# Patient Record
Sex: Female | Born: 1940 | Race: White | Hispanic: No | Marital: Married | Smoking: Current every day smoker
Health system: Southern US, Community
[De-identification: ages and names within clinical notes are randomized; demographics above are authoritative.]

## PROBLEM LIST (undated history)

## (undated) DIAGNOSIS — R413 Other amnesia: Secondary | ICD-10-CM

## (undated) DIAGNOSIS — IMO0001 Reserved for inherently not codable concepts without codable children: Secondary | ICD-10-CM

## (undated) DIAGNOSIS — Z972 Presence of dental prosthetic device (complete) (partial): Secondary | ICD-10-CM

## (undated) DIAGNOSIS — K219 Gastro-esophageal reflux disease without esophagitis: Secondary | ICD-10-CM

## (undated) DIAGNOSIS — J45909 Unspecified asthma, uncomplicated: Secondary | ICD-10-CM

## (undated) DIAGNOSIS — K209 Esophagitis, unspecified without bleeding: Secondary | ICD-10-CM

## (undated) DIAGNOSIS — F32A Depression, unspecified: Secondary | ICD-10-CM

## (undated) DIAGNOSIS — Z720 Tobacco use: Secondary | ICD-10-CM

## (undated) DIAGNOSIS — J449 Chronic obstructive pulmonary disease, unspecified: Secondary | ICD-10-CM

## (undated) DIAGNOSIS — M810 Age-related osteoporosis without current pathological fracture: Secondary | ICD-10-CM

## (undated) DIAGNOSIS — H269 Unspecified cataract: Secondary | ICD-10-CM

## (undated) DIAGNOSIS — F329 Major depressive disorder, single episode, unspecified: Secondary | ICD-10-CM

## (undated) DIAGNOSIS — G4700 Insomnia, unspecified: Secondary | ICD-10-CM

## (undated) DIAGNOSIS — F419 Anxiety disorder, unspecified: Secondary | ICD-10-CM

## (undated) HISTORY — DX: Major depressive disorder, single episode, unspecified: F32.9

## (undated) HISTORY — DX: Esophagitis, unspecified: K20.9

## (undated) HISTORY — DX: Tobacco use: Z72.0

## (undated) HISTORY — DX: Unspecified cataract: H26.9

## (undated) HISTORY — DX: Age-related osteoporosis without current pathological fracture: M81.0

## (undated) HISTORY — DX: Depression, unspecified: F32.A

## (undated) HISTORY — DX: Insomnia, unspecified: G47.00

## (undated) HISTORY — DX: Anxiety disorder, unspecified: F41.9

## (undated) HISTORY — DX: Esophagitis, unspecified without bleeding: K20.90

## (undated) HISTORY — DX: Gastro-esophageal reflux disease without esophagitis: K21.9

## (undated) HISTORY — DX: Chronic obstructive pulmonary disease, unspecified: J44.9

## (undated) HISTORY — PX: ABDOMINAL HYSTERECTOMY: SHX81

## (undated) HISTORY — DX: Unspecified asthma, uncomplicated: J45.909

---

## 2007-06-10 ENCOUNTER — Other Ambulatory Visit: Payer: Self-pay

## 2007-06-10 ENCOUNTER — Emergency Department: Payer: Self-pay | Admitting: Internal Medicine

## 2007-06-18 ENCOUNTER — Inpatient Hospital Stay: Payer: Self-pay | Admitting: Internal Medicine

## 2007-06-18 ENCOUNTER — Other Ambulatory Visit: Payer: Self-pay

## 2007-06-18 ENCOUNTER — Ambulatory Visit: Payer: Self-pay | Admitting: Family Medicine

## 2007-07-12 ENCOUNTER — Ambulatory Visit: Payer: Self-pay | Admitting: Internal Medicine

## 2007-08-14 ENCOUNTER — Ambulatory Visit: Payer: Self-pay | Admitting: Internal Medicine

## 2007-08-27 ENCOUNTER — Ambulatory Visit: Payer: Self-pay | Admitting: Internal Medicine

## 2007-12-27 ENCOUNTER — Ambulatory Visit: Payer: Self-pay | Admitting: Internal Medicine

## 2008-02-10 ENCOUNTER — Emergency Department: Payer: Self-pay | Admitting: Emergency Medicine

## 2008-02-10 ENCOUNTER — Other Ambulatory Visit: Payer: Self-pay

## 2008-07-31 ENCOUNTER — Ambulatory Visit: Payer: Self-pay | Admitting: Internal Medicine

## 2008-12-15 IMAGING — CR DG CHEST 2V
1 series · 2 of 2 positions shown · non-contrast
Comparison: none

REASON FOR EXAM: Right ML abscess, PNA
COMMENTS:

[Series 1: view not recorded · 0.17mm/px · 2 of 2 slices shown]
[im 1/2]
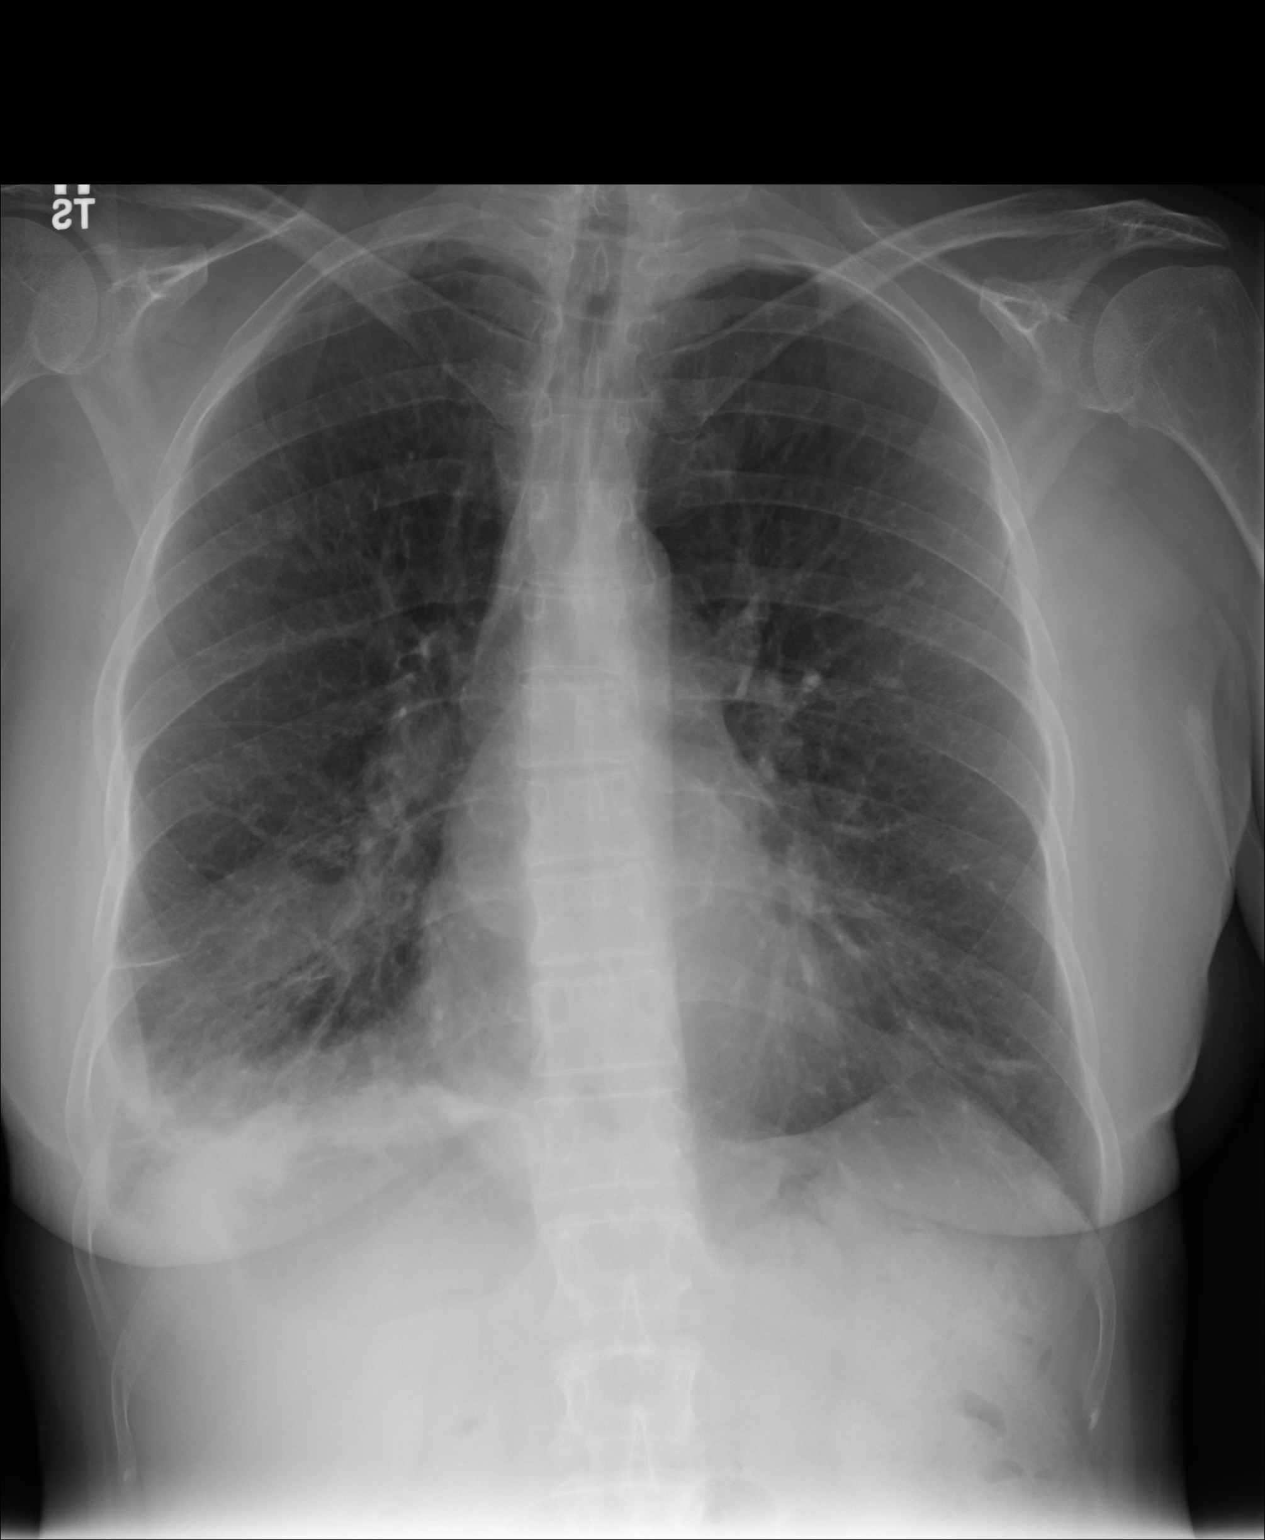
[im 2/2]
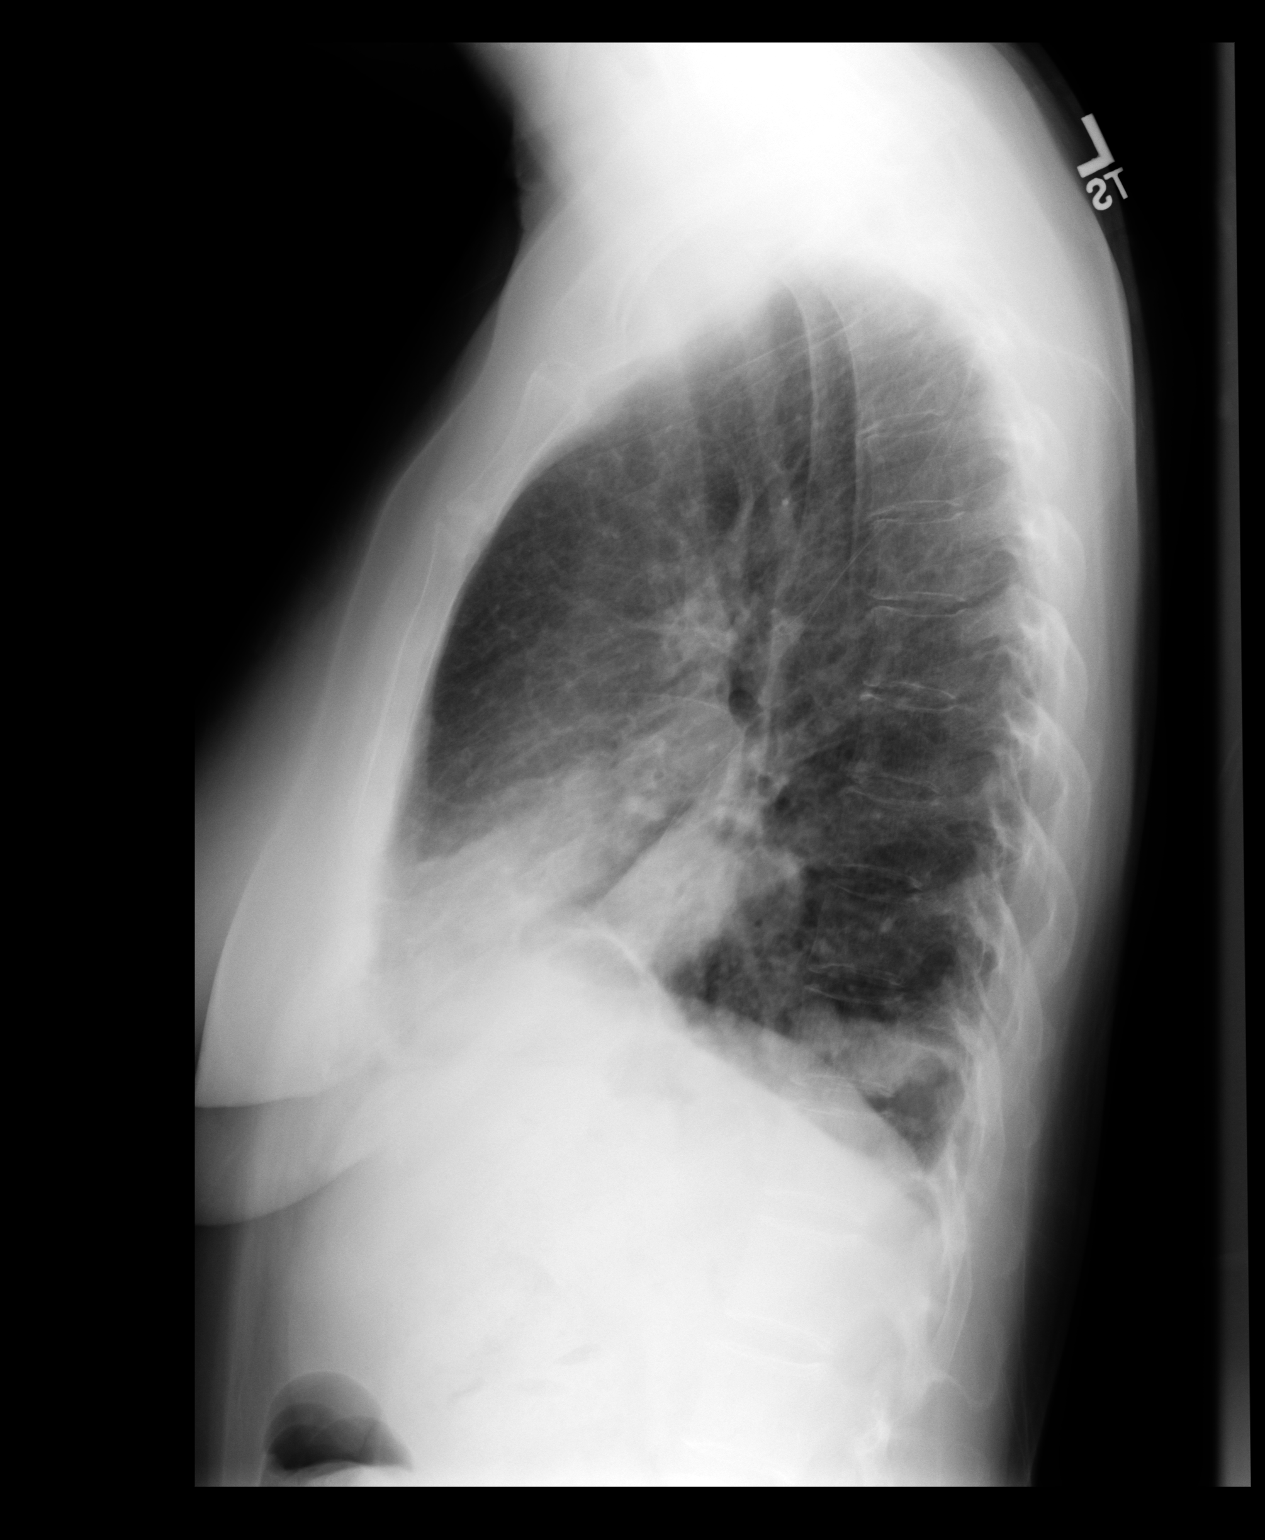

[2 of 2 positions shown; findings below may reference images not displayed]

PROCEDURE:     DXR - DXR CHEST PA (OR AP) AND LATERAL  - June 21, 2007  [DATE]

RESULT:     Comparison is made to the prior exam of 06/18/2007.

Bilateral pulmonary infiltrates compatible with pneumonia are again seen. On
the RIGHT, there is again noted a lucent area suspicious for early abscess
formation. This appears less prominent than on the exam of 06/18/2007. No
new pulmonary infiltrates are seen. The heart size is normal.
IMPRESSION: 1.  Persistent bilateral consolidated infiltrative changes are noted, more
prominent on the RIGHT.
2.  There is a radiolucency observed on the RIGHT consistent with cystic
change or possibly early abscess. This finding is noted to be less prominent
than on the exam of 06/18/2007.

## 2009-02-07 IMAGING — CT CT CHEST W/ CM
1 series · 15 of 31 positions shown, 19 images · IV contrast (agent unspecified)
Comparison: none

REASON FOR EXAM: Bronchitis   cough  eval condition
COMMENTS:

PROCEDURE:     CT  - CT CHEST WITH CONTRAST  - August 14, 2007  [DATE]
RESULT:     Comparison: Chest CT on 07/12/2007.
TECHNIQUE: CT examination of the chest was performed after intravenous
administration of 75 cc of [id number] nonionic contrast. Collimation is 5
mm.

[Series 2: soft tissue · axial · 0.64mm/px · z∈[+284,+574]mm · 15 of 64 slices shown, 19 images]
[im 3/64  mediastinal]
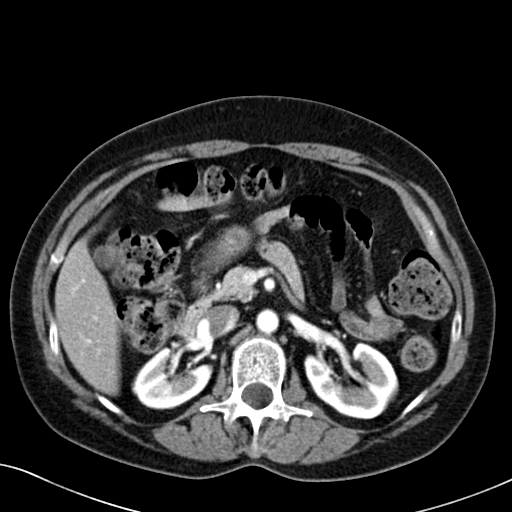
[im 3/64  lung]
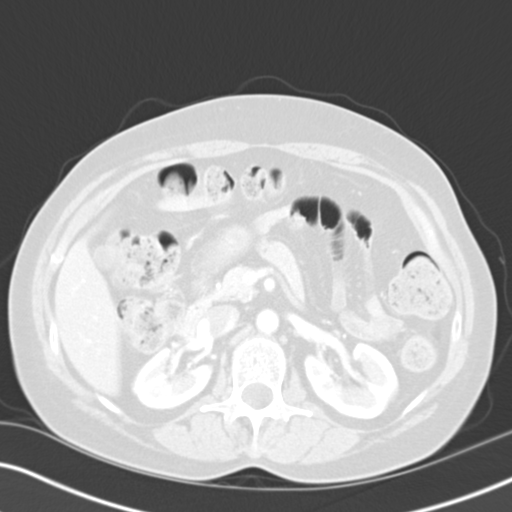
[im 8/64  lung]
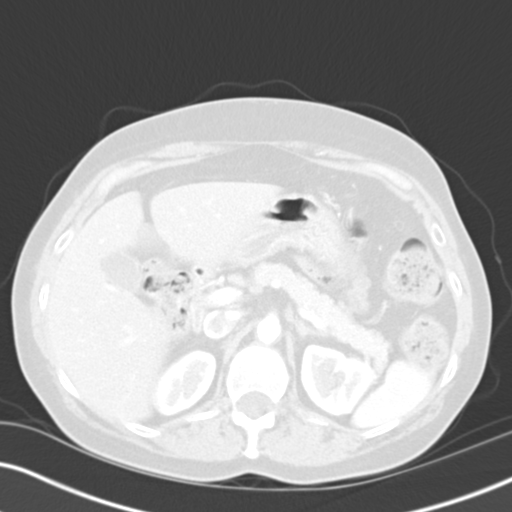
[im 12/64  lung]
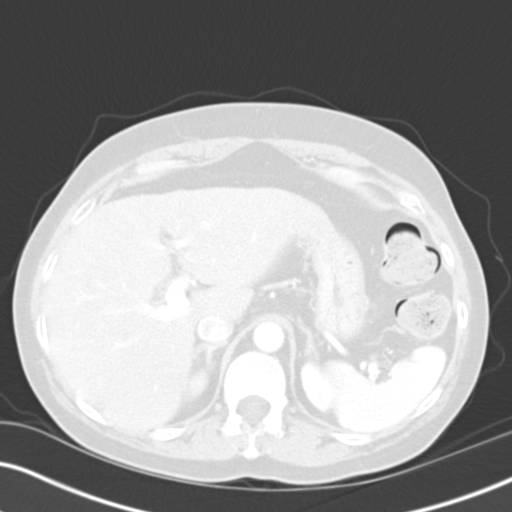
[im 15/64  lung]
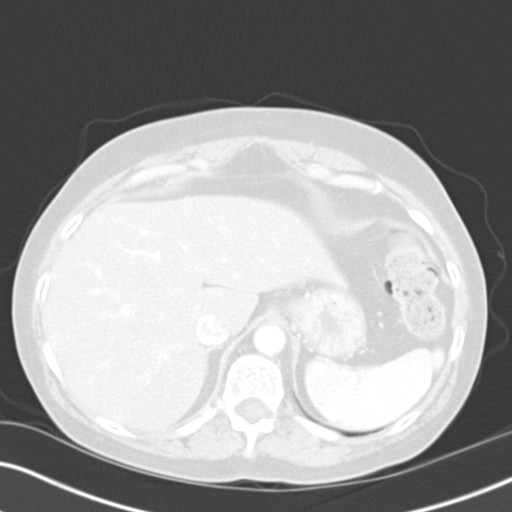
[im 19/64  mediastinal]
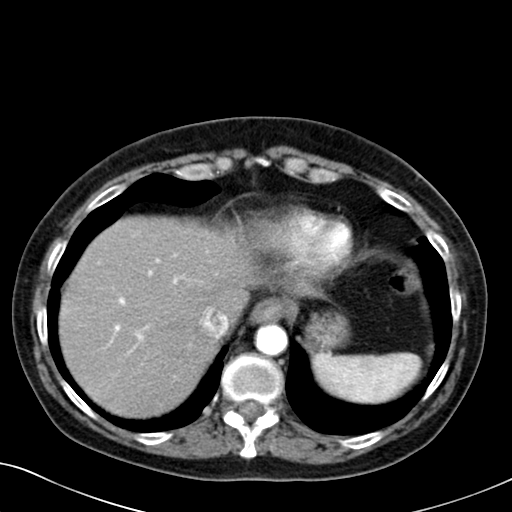
[im 19/64  lung]
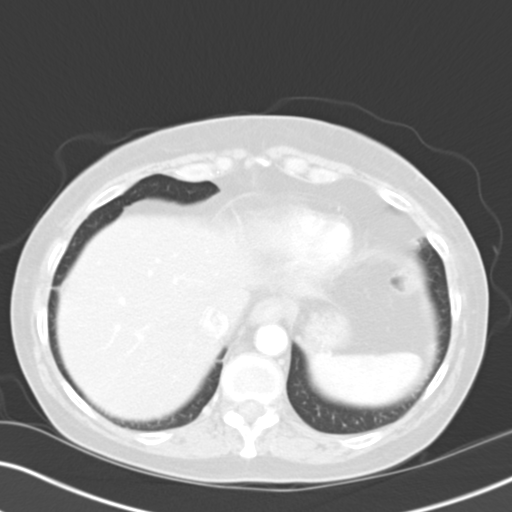
[im 24/64  lung]
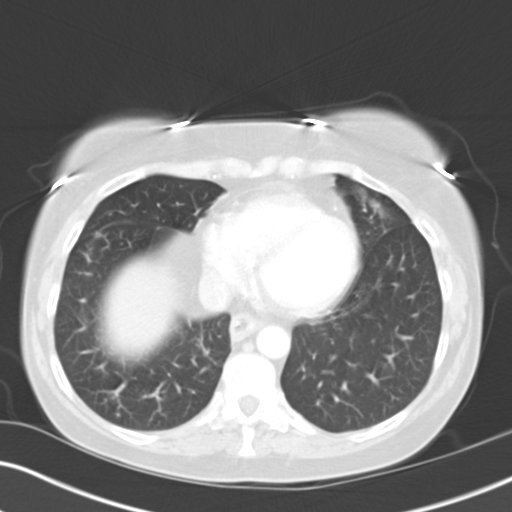
[im 29/64  lung]
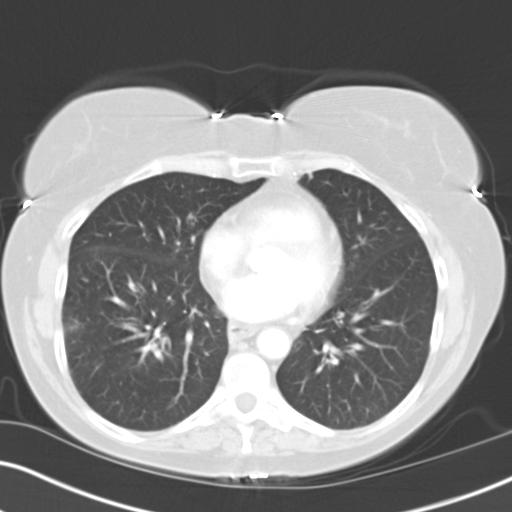
[im 33/64  lung]
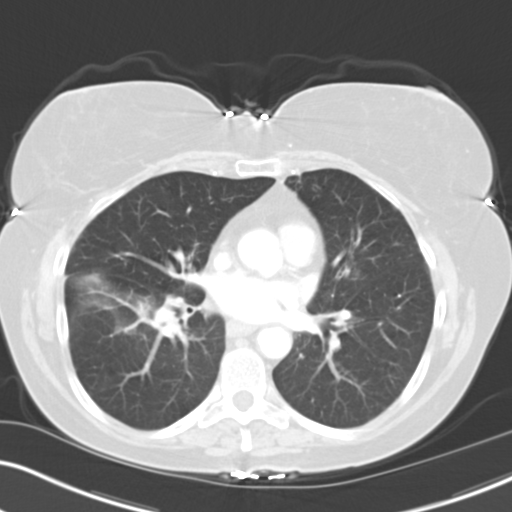
[im 36/64  mediastinal]
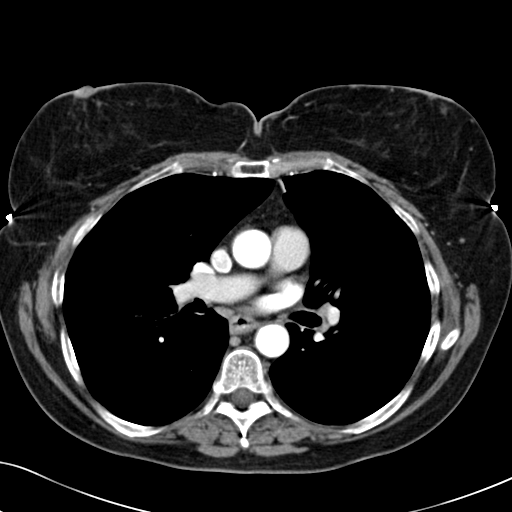
[im 36/64  lung]
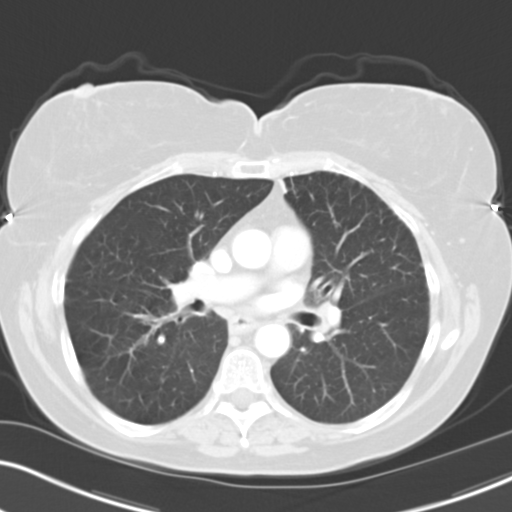
[im 40/64  lung]
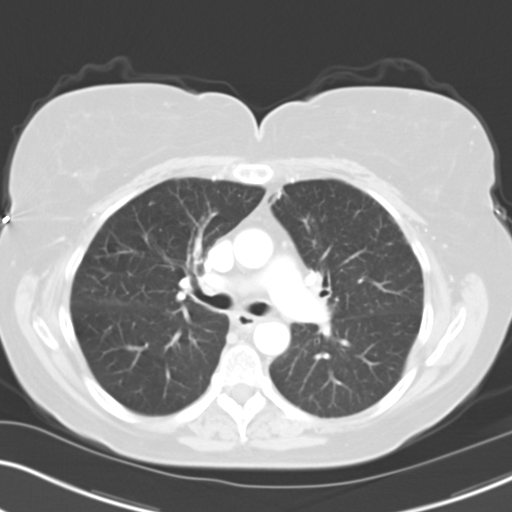
[im 45/64  lung]
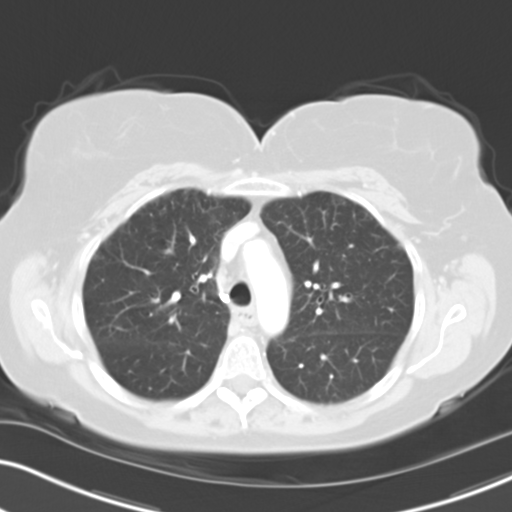
[im 50/64  lung]
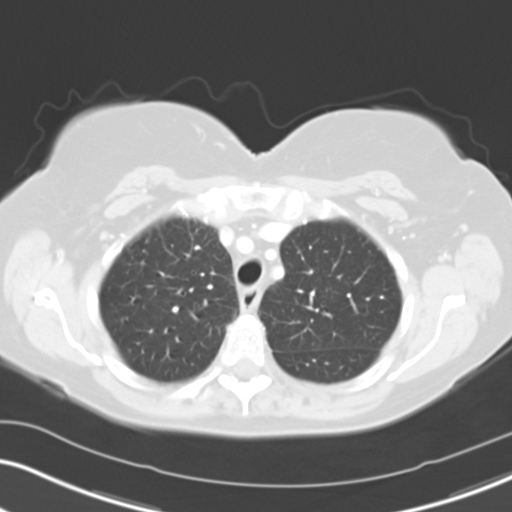
[im 52/64  mediastinal]
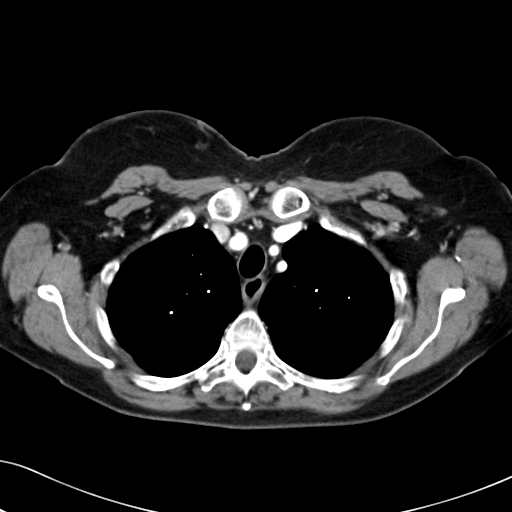
[im 52/64  lung]
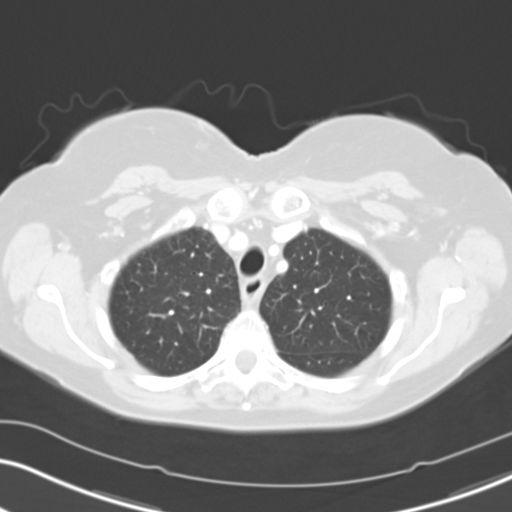
[im 57/64  lung]
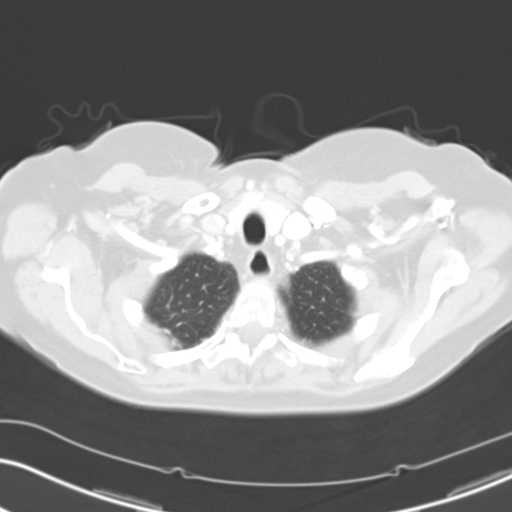
[im 61/64  lung]
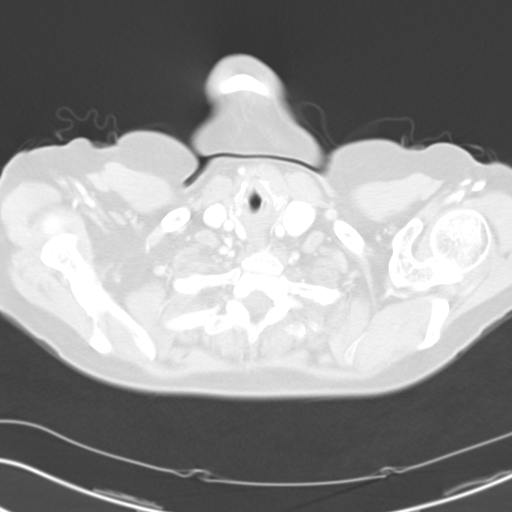

[15 of 31 positions shown; findings below may reference images not displayed]

FINDINGS: Redemonstration of heterogeneous, nodular appearance of the lower pole left
thyroid gland with few calcifications, similar compare to the previous exam.
Interval decrease in right basilar pulmonary opacities with mild residual.
There is minimal lingular atelectasis. 3 mm nodular opacity involving the
right middle lobe is stable compare to the previous exam. No new pulmonary
consolidation. No pulmonary edema. There is no pleural nor pericardial
effusion. There is no pneumothorax. There are no enlarged mediastinal,
hilar, or axillary lymph nodes. No displaced rib fracture is noted.

Limited visualization of the upper abdomen shows fatty infiltration of the
liver..
IMPRESSION: 1. Interval decrease in right basilar pulmonary opacities with mild
residual.
2. Redemonstration of heterogeneous, nodular appearance of the lower pole
left thyroid gland with few calcifications, similar compare to the previous
exam. This can be better evaluated with thyroid sonogram, as clinically
indicated.
3. Fatty infiltration of the liver.

## 2009-10-29 ENCOUNTER — Ambulatory Visit: Payer: Self-pay | Admitting: Internal Medicine

## 2011-12-30 HISTORY — PX: CEREBRAL ANEURYSM REPAIR: SHX164

## 2013-09-02 ENCOUNTER — Ambulatory Visit: Payer: Self-pay

## 2015-06-04 DIAGNOSIS — G4700 Insomnia, unspecified: Secondary | ICD-10-CM | POA: Insufficient documentation

## 2015-06-04 DIAGNOSIS — J45909 Unspecified asthma, uncomplicated: Secondary | ICD-10-CM | POA: Insufficient documentation

## 2015-06-04 DIAGNOSIS — F419 Anxiety disorder, unspecified: Secondary | ICD-10-CM | POA: Insufficient documentation

## 2015-06-04 DIAGNOSIS — J449 Chronic obstructive pulmonary disease, unspecified: Secondary | ICD-10-CM | POA: Insufficient documentation

## 2015-06-04 DIAGNOSIS — F32A Depression, unspecified: Secondary | ICD-10-CM | POA: Insufficient documentation

## 2015-06-04 DIAGNOSIS — F329 Major depressive disorder, single episode, unspecified: Secondary | ICD-10-CM | POA: Insufficient documentation

## 2015-06-04 DIAGNOSIS — M810 Age-related osteoporosis without current pathological fracture: Secondary | ICD-10-CM | POA: Insufficient documentation

## 2015-06-04 DIAGNOSIS — K209 Esophagitis, unspecified without bleeding: Secondary | ICD-10-CM | POA: Insufficient documentation

## 2015-06-04 DIAGNOSIS — K219 Gastro-esophageal reflux disease without esophagitis: Secondary | ICD-10-CM | POA: Insufficient documentation

## 2015-06-04 DIAGNOSIS — Z720 Tobacco use: Secondary | ICD-10-CM | POA: Insufficient documentation

## 2015-06-05 ENCOUNTER — Encounter: Payer: Self-pay | Admitting: Family Medicine

## 2015-06-05 ENCOUNTER — Ambulatory Visit (INDEPENDENT_AMBULATORY_CARE_PROVIDER_SITE_OTHER): Payer: Commercial Managed Care - HMO | Admitting: Family Medicine

## 2015-06-05 VITALS — BP 123/78 | HR 102 | Temp 98.6°F | Ht 61.2 in | Wt 121.1 lb

## 2015-06-05 DIAGNOSIS — J441 Chronic obstructive pulmonary disease with (acute) exacerbation: Secondary | ICD-10-CM

## 2015-06-05 MED ORDER — DOXYCYCLINE HYCLATE 100 MG PO CAPS: 100.0000 mg | ORAL_CAPSULE | Freq: Two times a day (BID) | ORAL | Status: DC

## 2015-06-05 MED ORDER — PREDNISONE 10 MG PO TABS: ORAL_TABLET | ORAL | Status: DC

## 2015-06-05 NOTE — Assessment & Plan Note (Signed)
Better following neb, lungs sound clearer. Will treat with doxycycline BID x 10 days and a 12 day prednisone taper. Call if not getting better or getting worse. Return in 2 weeks for lung recheck.

## 2015-06-05 NOTE — Progress Notes (Signed)
BP 123/78 mmHg  Pulse 102  Temp(Src) 98.6 F (37 C)  Ht 5' 1.2" (1.554 m)  Wt 121 lb 1.6 oz (54.931 kg)  BMI 22.75 kg/m2  SpO2 94%   Subjective:    Patient ID: Paula Krause, female    DOB: 1941-05-05, 74 y.o.   MRN: [id number]  HPI: Paula Krause is a 74 y.o. female  Chief Complaint  Patient presents with  . URI    X a couple weeks, patient states that she has a lot of nasal congestion and cough.  . Loss of Vision    X 4 days Right eye, has not called the eye doctor. Has had surgery on it previously   UPPER RESPIRATORY TRACT INFECTION x2 weeks Worst symptom: congestion Fever: yes Cough: yes Shortness of breath: no Wheezing: no Chest pain: no Chest tightness: no Chest congestion: no Nasal congestion: yes Runny nose: yes Post nasal drip: yes Sneezing: yes Sore throat: yes Swollen glands: yes Sinus pressure: yes Headache: no Face pain: yes Toothache: no Ear pain: no  Ear pressure: no  Eyes red/itching:no Eye drainage/crusting: yes  Vomiting: no Rash: no Fatigue: yes Sick contacts: no Strep contacts: no  Context: worse Recurrent sinusitis: no Relief with OTC cold/cough medications: no  Treatments attempted: cold/sinus, mucinex, anti-histamine, pseudoephedrine and cough syrup    Relevant past medical, surgical, family and social history reviewed and updated as indicated. Interim medical history since our last visit reviewed. Allergies and medications reviewed and updated.  Review of Systems  Constitutional: Positive for appetite change and fatigue. Negative for fever, chills, diaphoresis, activity change and unexpected weight change.  HENT: Positive for congestion, postnasal drip, rhinorrhea, sinus pressure, sneezing and sore throat. Negative for dental problem, drooling, ear discharge, ear pain, facial swelling, hearing loss, mouth sores, nosebleeds, tinnitus, trouble swallowing and voice change.   Respiratory: Negative for apnea, cough, choking,  chest tightness, shortness of breath, wheezing and stridor.   Cardiovascular: Negative for chest pain, palpitations and leg swelling.  Neurological: Negative.   Psychiatric/Behavioral: Negative.    Per HPI unless specifically indicated above     Objective:    BP 123/78 mmHg  Pulse 102  Temp(Src) 98.6 F (37 C)  Ht 5' 1.2" (1.554 m)  Wt 121 lb 1.6 oz (54.931 kg)  BMI 22.75 kg/m2  SpO2 94%  Wt Readings from Last 3 Encounters:  06/05/15 121 lb 1.6 oz (54.931 kg)  11/12/14 128 lb (58.06 kg)    Physical Exam  Constitutional: She is oriented to person, place, and time. She appears well-developed and well-nourished. No distress.  HENT:  Head: Normocephalic and atraumatic.  Right Ear: Hearing and external ear normal.  Left Ear: Hearing and external ear normal.  Nose: Mucosal edema and rhinorrhea present. No nose lacerations, sinus tenderness, nasal deformity, septal deviation or nasal septal hematoma. No epistaxis.  No foreign bodies. Right sinus exhibits no maxillary sinus tenderness and no frontal sinus tenderness. Left sinus exhibits no maxillary sinus tenderness and no frontal sinus tenderness.  Mouth/Throat: Oropharynx is clear and moist. No oropharyngeal exudate.  Cerumen bilaterally  Eyes: Conjunctivae and lids are normal. Right eye exhibits no discharge. Left eye exhibits no discharge. No scleral icterus.  Neck: Normal range of motion. Neck supple. No JVD present. No tracheal deviation present. No thyromegaly present.  Cardiovascular: Normal rate, regular rhythm and normal heart sounds.  Exam reveals no gallop and no friction rub.   No murmur heard. Pulmonary/Chest: Effort normal. No stridor. No respiratory distress. She  has decreased breath sounds in the right upper field, the right middle field, the right lower field, the left upper field, the left middle field and the left lower field. She has wheezes in the right upper field, the right middle field, the right lower field, the  left upper field, the left middle field and the left lower field. She has rhonchi in the right upper field, the right middle field, the right lower field, the left upper field, the left middle field and the left lower field. She has no rales. She exhibits no tenderness.  Musculoskeletal: Normal range of motion.  Lymphadenopathy:    She has cervical adenopathy.  Neurological: She is alert and oriented to person, place, and time.  Skin: Skin is warm, dry and intact. No rash noted. No erythema. No pallor.  Psychiatric: She has a normal mood and affect. Her speech is normal and behavior is normal. Judgment and thought content normal. Cognition and memory are normal.  Nursing note and vitals reviewed.   No results found for this or any previous visit.    Assessment & Plan:   Problem List Items Addressed This Visit      Respiratory   COPD exacerbation - Primary    Better following neb, lungs sound clearer. Will treat with doxycycline BID x 10 days and a 12 day prednisone taper. Call if not getting better or getting worse. Return in 2 weeks for lung recheck.           Follow up plan: Return in about 2 weeks (around 06/19/2015) for Lung recheck.

## 2015-06-05 NOTE — Patient Instructions (Signed)
Heat Stress in the Elderly  Elderly people (people aged 72 years and older) are more prone to heat stress than younger people for several reasons:   Elderly people do not adjust as well as young people to sudden changes in temperature.  They are more likely to have a chronic medical condition that upsets normal body responses to heat.  They are more likely to take prescription medicines that impair the body's ability to regulate its temperature or that inhibit perspiration. HEAT STROKE  Heat stroke is the most serious heat-related illness. It occurs when the body becomes unable to control its temperature. The body temperature rises rapidly. Then the body loses its ability to sweat and is unable to cool down. The body temperature rises to 105 F (40.6 C) or higher within 10 to 15 minutes. Heat stroke can cause death or permanent disability if emergency treatment is not provided. SYMPTOMS  Warning signs vary but may include the following:  An extremely high body temperature (above 103 F (39.4 C)).  Nausea.  Red, hot, and dry skin (no sweating).  Rapid, strong pulse.  Throbbing headache.  Dizziness. HEAT EXHAUSTION  Heat exhaustion is a milder form of heat-related illness. It can develop after several days of exposure to high temperatures and not enough fluids. SYMPTOMS  Warning signs vary but may include the following:   Heavy sweating. Paleness.  Muscle cramps.  Tiredness. Weakness.  Dizziness.  Headache. Nausea or vomiting.  Fainting.  Skin: may be cool and moist.  Pulse rate: fast and weak.  Breathing: fast and shallow. WHAT YOU CAN DO TO PROTECT YOURSELF  You can follow these prevention tips to protect yourself from heat-related stress:   Drink cool, nonalcoholic, non-caffeinated beverages. If your caregiver generally limits the amount of fluid you drink or has you on water pills, ask how much you should drink when the weather is hot. Avoid extremely cold  liquids. They can cause cramps.  Rest.  Take a cool shower, bath, or sponge bath.  If possible, seek an air-conditioned environment. If you do not have air conditioning, visit an air-conditioned shopping mall or [place] to cool off.  Emergency planning/management officer.  If possible, remain indoors in the heat of the day.  Do not engage in strenuous activities. WHAT YOU CAN DO TO HELP PROTECT ELDERLY RELATIVES AND NEIGHBORS  If you have elderly relatives or neighbors, help them protect themselves from heat-related stress.   Visit older adults at risk at least twice a day. Watch them for signs of heat exhaustion or heat stroke.  Take them to air-conditioned locations if they have transportation problems.  Make sure older adults have access to an electric fan whenever possible. WHAT YOU CAN DO FOR SOMEONE WITH HEAT STRESS   If you see any signs of severe heat stress, you may be dealing with a life-threatening emergency. Have someone call for immediate medical assistance while you begin cooling the affected person. Do the following:  Get the person to a shady area.  Cool the person rapidly, using whatever methods you can. For example, immerse the person in a tub of cool water or place the person in a cool shower. Spray the person with cool water from a garden hose or sponge the person with cool water. If the humidity is low, wrap the person in a cool, wet sheet. Fan him/her quickly.  Monitor body temperature. Continue cooling efforts until the body temperature drops to 101 - 102F (38.3  C - 38.9  C).  If emergency medical personnel are delayed, call the hospital emergency room for further instructions.  Do not give the person alcohol to drink.  Get medical care as soon as possible. Document Released: 11/02/2009 Document Revised: 02/06/2012 Document Reviewed: 11/02/2009 [facility] Patient Information 2015 [place], [place]. This information is not intended to replace advice given to you  by your health care provider. Make sure you discuss any questions you have with your health care provider.

## 2015-06-24 ENCOUNTER — Other Ambulatory Visit: Payer: Self-pay

## 2015-06-24 MED ORDER — AMITRIPTYLINE HCL 50 MG PO TABS: 200.0000 mg | ORAL_TABLET | Freq: Every day | ORAL | Status: DC

## 2015-06-24 NOTE — Telephone Encounter (Signed)
Patient called and asked if Paula Krause would send her in one refill for her amitriptyline to [facility]. Patient stated she is not able to come in right away because she is getting ready to have eye surgery and patient has been out of medication for a few days now.

## 2015-07-13 ENCOUNTER — Ambulatory Visit (INDEPENDENT_AMBULATORY_CARE_PROVIDER_SITE_OTHER): Payer: Commercial Managed Care - HMO | Admitting: Unknown Physician Specialty

## 2015-07-13 ENCOUNTER — Encounter: Payer: Self-pay | Admitting: Unknown Physician Specialty

## 2015-07-13 VITALS — BP 110/75 | HR 101 | Temp 98.6°F | Ht 61.6 in | Wt 124.4 lb

## 2015-07-13 DIAGNOSIS — K2100 Gastro-esophageal reflux disease with esophagitis, without bleeding: Secondary | ICD-10-CM

## 2015-07-13 DIAGNOSIS — G4700 Insomnia, unspecified: Secondary | ICD-10-CM

## 2015-07-13 DIAGNOSIS — Z01818 Encounter for other preprocedural examination: Secondary | ICD-10-CM

## 2015-07-13 DIAGNOSIS — H269 Unspecified cataract: Secondary | ICD-10-CM | POA: Diagnosis not present

## 2015-07-13 MED ORDER — BUDESONIDE-FORMOTEROL FUMARATE 160-4.5 MCG/ACT IN AERO: 2.0000 | INHALATION_SPRAY | Freq: Two times a day (BID) | RESPIRATORY_TRACT | Status: DC

## 2015-07-13 MED ORDER — ZALEPLON 5 MG PO CAPS: 5.0000 mg | ORAL_CAPSULE | Freq: Every evening | ORAL | Status: DC | PRN

## 2015-07-13 MED ORDER — OMEPRAZOLE 20 MG PO CPDR: 20.0000 mg | DELAYED_RELEASE_CAPSULE | Freq: Every day | ORAL | Status: AC

## 2015-07-13 MED ORDER — AMITRIPTYLINE HCL 50 MG PO TABS
200.0000 mg | ORAL_TABLET | Freq: Every day | ORAL | Status: DC
Start: 2015-07-13 — End: 2015-12-25

## 2015-07-13 NOTE — Progress Notes (Signed)
+++++++++++++++++++++++++++++++++++++++++++  BP 110/75 mmHg  Pulse 101  Temp(Src) 98.6 F (37 C)  Ht 5' 1.6" (1.565 m)  Wt 124 lb 6.4 oz (56.427 kg)  BMI 23.04 kg/m2  SpO2 95%   Subjective:    Patient ID: Paula Krause, female    DOB: 09-Jun-1941, 74 y.o.   MRN: [id number]  HPI: Paula Krause is a 74 y.o. female  Chief Complaint  Patient presents with  . Gastrophageal Reflux  . Insomnia  . Medication Refill   Gastrophageal Reflux She complains of dysphagia, a hoarse voice and water brash. Burning after meals and at night. This is a chronic problem. The current episode started more than 1 year ago. The problem occurs constantly. The problem has been unchanged. The symptoms are aggravated by caffeine. She has tried an antacid for the symptoms. The treatment provided no relief.  Insomnia The current episode started more than one month. The problem occurs nightly. Past treatments include medication.  States she is having trouble getting to sleep and up until 2-3 A.    Needs cataract surgery Can't see out of her right eye.  Needs surgery and approval  COPD Out of Symbicort.  Sees Dr Humphrey Rolls but wants a refill from me.    Relevant past medical, surgical, family and social history reviewed and updated as indicated. Interim medical history since our last visit reviewed. Allergies and medications reviewed and updated.  Review of Systems  HENT: Positive for hoarse voice.   Gastrointestinal: Positive for dysphagia.  Psychiatric/Behavioral: The patient has insomnia.     Per HPI unless specifically indicated above     Objective:    BP 110/75 mmHg  Pulse 101  Temp(Src) 98.6 F (37 C)  Ht 5' 1.6" (1.565 m)  Wt 124 lb 6.4 oz (56.427 kg)  BMI 23.04 kg/m2  SpO2 95%  Wt Readings from Last 3 Encounters:  07/13/15 124 lb 6.4 oz (56.427 kg)  06/05/15 121 lb 1.6 oz (54.931 kg)  11/12/14 128 lb (58.06 kg)    Physical Exam  Constitutional: She is oriented to person, place, and  time. She appears well-developed and well-nourished. No distress.  HENT:  Head: Normocephalic and atraumatic.  Eyes: Conjunctivae and lids are normal. Right eye exhibits no discharge. Left eye exhibits no discharge. No scleral icterus.  Cardiovascular: Normal rate and regular rhythm.   Pulmonary/Chest: Effort normal and breath sounds normal. No respiratory distress.  Abdominal: Normal appearance and bowel sounds are normal. She exhibits no distension. There is no splenomegaly or hepatomegaly. There is no tenderness.  Musculoskeletal: Normal range of motion.  Neurological: She is alert and oriented to person, place, and time.  Skin: Skin is warm, dry and intact. No rash noted. No pallor.  Psychiatric: She has a normal mood and affect. Her behavior is normal. Judgment and thought content normal.    No results found for this or any previous visit.    Assessment & Plan:   Problem List Items Addressed This Visit      Unprioritized   GERD (gastroesophageal reflux disease)    Will rx Omeprazole.  She has been on this before      Relevant Medications   omeprazole (PRILOSEC) 20 MG capsule   Insomnia - Primary    She will ask if her apartment can be moved away from the street.  Refilll Amitriptyline.  Rx for Sonata she agrees to use sparingly       Other Visit Diagnoses    Cataract, right  OK for surgery    Relevant Orders    CBC with Differential/Platelet    Comprehensive metabolic panel    Pre-op evaluation        High risk for general due to COPD.  OK if not general anesthesia    Relevant Orders    CBC with Differential/Platelet    Comprehensive metabolic panel        Follow up plan: Return for Needs pe scheduled.

## 2015-07-13 NOTE — Patient Instructions (Signed)
Insomnia Insomnia is frequent trouble falling and/or staying asleep. Insomnia can be a long term problem or a short term problem. Both are common. Insomnia can be a short term problem when the wakefulness is related to a certain stress or worry. Long term insomnia is often related to ongoing stress during waking hours and/or poor sleeping habits. Overtime, sleep deprivation itself can make the problem worse. Every little thing feels more severe because you are overtired and your ability to cope is decreased. CAUSES   Stress, anxiety, and depression.  Poor sleeping habits.  Distractions such as TV in the bedroom.  Naps close to bedtime.  Engaging in emotionally charged conversations before bed.  Technical reading before sleep.  Alcohol and other sedatives. They may make the problem worse. They can hurt normal sleep patterns and normal dream activity.  Stimulants such as caffeine for several hours prior to bedtime.  Pain syndromes and shortness of breath can cause insomnia.  Exercise late at night.  Changing time zones may cause sleeping problems (jet lag). It is sometimes helpful to have someone observe your sleeping patterns. They should look for periods of not breathing during the night (sleep apnea). They should also look to see how long those periods last. If you live alone or observers are uncertain, you can also be observed at a sleep clinic where your sleep patterns will be professionally monitored. Sleep apnea requires a checkup and treatment. Give your caregivers your medical history. Give your caregivers observations your family has made about your sleep.  SYMPTOMS   Not feeling rested in the morning.  Anxiety and restlessness at bedtime.  Difficulty falling and staying asleep. TREATMENT   Your caregiver may prescribe treatment for an underlying medical disorders. Your caregiver can give advice or help if you are using alcohol or other drugs for self-medication. Treatment  of underlying problems will usually eliminate insomnia problems.  Medications can be prescribed for short time use. They are generally not recommended for lengthy use.  Over-the-counter sleep medicines are not recommended for lengthy use. They can be habit forming.  You can promote easier sleeping by making lifestyle changes such as:  Using relaxation techniques that help with breathing and reduce muscle tension.  Exercising earlier in the day.  Changing your diet and the time of your last meal. No night time snacks.  Establish a regular time to go to bed.  Counseling can help with stressful problems and worry.  Soothing music and white noise may be helpful if there are background noises you cannot remove.  Stop tedious detailed work at least one hour before bedtime. HOME CARE INSTRUCTIONS   Keep a diary. Inform your caregiver about your progress. This includes any medication side effects. See your caregiver regularly. Take note of:  Times when you are asleep.  Times when you are awake during the night.  The quality of your sleep.  How you feel the next day. This information will help your caregiver care for you.  Get out of bed if you are still awake after 15 minutes. Read or do some quiet activity. Keep the lights down. Wait until you feel sleepy and go back to bed.  Keep regular sleeping and waking hours. Avoid naps.  Exercise regularly.  Avoid distractions at bedtime. Distractions include watching television or engaging in any intense or detailed activity like attempting to balance the household checkbook.  Develop a bedtime ritual. Keep a familiar routine of bathing, brushing your teeth, climbing into bed at the same   time each night, listening to soothing music. Routines increase the success of falling to sleep faster.  Use relaxation techniques. This can be using breathing and muscle tension release routines. It can also include visualizing peaceful scenes. You can  also help control troubling or intruding thoughts by keeping your mind occupied with boring or repetitive thoughts like the old concept of counting sheep. You can make it more creative like imagining planting one beautiful flower after another in your backyard garden.  During your day, work to eliminate stress. When this is not possible use some of the previous suggestions to help reduce the anxiety that accompanies stressful situations. MAKE SURE YOU:   Understand these instructions.  Will watch your condition.  Will get help right away if you are not doing well or get worse. Document Released: 11/11/2000 Document Revised: 02/06/2012 Document Reviewed: 12/12/2007 [employer] Patient Information 2015 [employer]. This information is not intended to replace advice given to you by your health care provider. Make sure you discuss any questions you have with your health care provider.  

## 2015-07-13 NOTE — Assessment & Plan Note (Signed)
Will rx Omeprazole.  She has been on this before

## 2015-07-13 NOTE — Assessment & Plan Note (Signed)
Refill Symbicort 

## 2015-07-13 NOTE — Assessment & Plan Note (Addendum)
She will ask if her apartment can be moved away from the street.  Refilll Amitriptyline.  Rx for Sonata she agrees to use sparingly

## 2015-07-14 LAB — COMPREHENSIVE METABOLIC PANEL
A/G RATIO: 1.8 (ref 1.1–2.5)
ALT: 7 IU/L (ref 0–32)
AST: 12 IU/L (ref 0–40)
Albumin: 4.1 g/dL (ref 3.5–4.8)
Alkaline Phosphatase: 139 IU/L — ABNORMAL HIGH (ref 39–117)
BUN/Creatinine Ratio: 22 (ref 11–26)
BUN: 20 mg/dL (ref 8–27)
Bilirubin Total: <0.2 mg/dL (ref 0.0–1.2)
CALCIUM: 9.5 mg/dL (ref 8.7–10.3)
CO2: 24 mmol/L (ref 18–29)
CREATININE: 0.93 mg/dL (ref 0.57–1.00)
Chloride: 103 mmol/L (ref 97–108)
GFR, EST AFRICAN AMERICAN: 71 mL/min/{1.73_m2} (ref >59)
GFR, EST NON AFRICAN AMERICAN: 62 mL/min/{1.73_m2} (ref >59)
Globulin, Total: 2.3 g/dL (ref 1.5–4.5)
Glucose: 122 mg/dL — ABNORMAL HIGH (ref 65–99)
POTASSIUM: 4.8 mmol/L (ref 3.5–5.2)
Sodium: 145 mmol/L — ABNORMAL HIGH (ref 134–144)
TOTAL PROTEIN: 6.4 g/dL (ref 6.0–8.5)

## 2015-07-14 LAB — CBC WITH DIFFERENTIAL/PLATELET
BASOS: 1 %
Basophils Absolute: 0.1 10*3/uL (ref 0.0–0.2)
EOS (ABSOLUTE): 0.9 10*3/uL — AB (ref 0.0–0.4)
EOS: 9 %
HEMATOCRIT: 41.9 % (ref 34.0–46.6)
Hemoglobin: 13.4 g/dL (ref 11.1–15.9)
IMMATURE GRANS (ABS): 0 10*3/uL (ref 0.0–0.1)
IMMATURE GRANULOCYTES: 0 %
LYMPHS: 20 %
Lymphocytes Absolute: 1.9 10*3/uL (ref 0.7–3.1)
MCH: 28.8 pg (ref 26.6–33.0)
MCHC: 32 g/dL (ref 31.5–35.7)
MCV: 90 fL (ref 79–97)
Monocytes Absolute: 0.7 10*3/uL (ref 0.1–0.9)
Monocytes: 8 %
NEUTROS PCT: 62 %
Neutrophils Absolute: 6.1 10*3/uL (ref 1.4–7.0)
PLATELETS: 356 10*3/uL (ref 150–379)
RBC: 4.66 x10E6/uL (ref 3.77–5.28)
RDW: 14.3 % (ref 12.3–15.4)
WBC: 9.8 10*3/uL (ref 3.4–10.8)

## 2015-07-16 ENCOUNTER — Telehealth: Payer: Self-pay | Admitting: Family Medicine

## 2015-07-16 NOTE — Telephone Encounter (Signed)
Called and left a voicemail for the surgical coordinator asking what paperwork they need for this patient. Asked for them to please return my call and let me know.

## 2015-07-16 NOTE — Telephone Encounter (Signed)
Did the patient give you any paperwork during her visit on Monday? She did not give me any and I was trying to see if you may know anything about this before I call the patient.

## 2015-07-16 NOTE — Telephone Encounter (Signed)
Pt called stated we were supposed to fax paperwork to Dr. Herbert Deaner @ [facility] for surgical clearance for Cataract surgery. Please fax the required paperwork ASAP. Pt's surgery is scheduled for 07/21/15. Thanks.

## 2015-07-16 NOTE — Telephone Encounter (Signed)
I asked if she had any and she said no.  Perhaps they just need to note?

## 2015-07-16 NOTE — Telephone Encounter (Signed)
Called patient and asked what paperwork she was exactly referring to. Patient states just paperwork stating she is clear for surgery.

## 2015-07-16 NOTE — Telephone Encounter (Signed)
Fax came in requesting patient's last OV note, so I sent it to them.

## 2015-08-24 ENCOUNTER — Encounter: Payer: Self-pay | Admitting: Unknown Physician Specialty

## 2015-08-24 ENCOUNTER — Ambulatory Visit (INDEPENDENT_AMBULATORY_CARE_PROVIDER_SITE_OTHER): Payer: Commercial Managed Care - HMO | Admitting: Unknown Physician Specialty

## 2015-08-24 VITALS — BP 117/77 | HR 97 | Temp 98.6°F | Ht 62.2 in | Wt 123.2 lb

## 2015-08-24 DIAGNOSIS — G4700 Insomnia, unspecified: Secondary | ICD-10-CM

## 2015-08-24 DIAGNOSIS — J309 Allergic rhinitis, unspecified: Secondary | ICD-10-CM | POA: Diagnosis not present

## 2015-08-24 DIAGNOSIS — Z23 Encounter for immunization: Secondary | ICD-10-CM

## 2015-08-24 MED ORDER — FLUTICASONE PROPIONATE 50 MCG/ACT NA SUSP: 2.0000 | Freq: Every day | NASAL | Status: DC

## 2015-08-24 NOTE — Progress Notes (Signed)
BP 117/77 mmHg  Pulse 97  Temp(Src) 98.6 F (37 C)  Ht 5' 2.2" (1.58 m)  Wt 123 lb 3.2 oz (55.883 kg)  BMI 22.39 kg/m2  SpO2 95%   Subjective:    Patient ID: Paula Krause, female    DOB: 21-Sep-1941, 74 y.o.   MRN: [id number]  HPI: Paula Krause is a 74 y.o. female  Chief Complaint  Patient presents with  . Annual Exam  . Sinusitis    pt states she has congestion, headache and runny nose. States she has had these symptoms for about 4 weeks now  . Insomnia    pt states shes cannot sleep at night   "I'm not doing well."  Pt states she is having a runny nose and congestion for 5-6 weeks.  She states "I have to blow my nose and blow my nose" and wonders if she has allergies.  Cough is OK.    Insomnia Pt states she can't sleep at night due to being "right on the road" in [place].  States traffic is "all night long."  She would like a note stating she needs an apartment that is away from the road.  She is in a rent-controlled apartment for the elderly and disabled.    Relevant past medical, surgical, family and social history reviewed and updated as indicated. Interim medical history since our last visit reviewed. Allergies and medications reviewed and updated.  See functional status, depression screen, and fall's risk assessment  under the appropriate section.    Pt is able to  recognize clock face, recognize time and do a 3 item recall.     Relevant past medical, surgical, family and social history reviewed and updated as indicated. Interim medical history since our last visit reviewed. Allergies and medications reviewed and updated.  Review of Systems  Constitutional: Positive for fatigue.  HENT: Positive for congestion, sinus pressure and sore throat.   Eyes: Negative.   Respiratory: Negative.   Cardiovascular: Negative.   Gastrointestinal: Negative.   Endocrine: Negative.   Genitourinary: Negative.   Musculoskeletal:  Negative.   Skin: Negative.   Allergic/Immunologic: Negative.   Neurological: Negative.   Hematological: Negative.   Psychiatric/Behavioral: Negative.     Per HPI unless specifically indicated above     Objective:    BP 117/77 mmHg  Pulse 97  Temp(Src) 98.6 F (37 C)  Ht 5' 2.2" (1.58 m)  Wt 123 lb 3.2 oz (55.883 kg)  BMI 22.39 kg/m2  SpO2 95%  Wt Readings from Last 3 Encounters:  08/24/15 123 lb 3.2 oz (55.883 kg)  07/13/15 124 lb 6.4 oz (56.427 kg)  06/05/15 121 lb 1.6 oz (54.931 kg)    Physical Exam  Constitutional: She is oriented to person, place, and time. She appears well-developed and well-nourished.  HENT:  Head: Normocephalic and atraumatic.  Eyes: Pupils are equal, round, and reactive to light. Right eye exhibits no discharge. Left eye exhibits no discharge. No scleral icterus.  Neck: Normal range of motion. Neck supple. Carotid bruit is not present. No thyromegaly present.  Cardiovascular: Normal rate, regular rhythm and normal heart sounds.  Exam reveals no gallop and no friction rub.   No murmur heard. Pulmonary/Chest: Effort normal and breath sounds normal. No respiratory distress. She has no wheezes. She has no rales.  Abdominal: Soft. Bowel sounds are normal. There is no tenderness. There is no rebound.  Genitourinary: No breast swelling, tenderness or discharge.  Musculoskeletal: Normal range of motion.  Lymphadenopathy:  She has no cervical adenopathy.  Neurological: She is alert and oriented to person, place, and time.  Skin: Skin is warm, dry and intact. No rash noted.  Psychiatric: She has a normal mood and affect. Her speech is normal and behavior is normal. Judgment and thought content normal. Cognition and memory are normal.    Results for orders placed or performed in visit on 07/13/15  CBC with Differential/Platelet  Result Value Ref Range   WBC 9.8 3.4 - 10.8 x10E3/uL   RBC 4.66 3.77 - 5.28 x10E6/uL   Hemoglobin 13.4 11.1 - 15.9 g/dL    Hematocrit 41.9 34.0 - 46.6 %   MCV 90 79 - 97 fL   MCH 28.8 26.6 - 33.0 pg   MCHC 32.0 31.5 - 35.7 g/dL   RDW 14.3 12.3 - 15.4 %   Platelets 356 150 - 379 x10E3/uL   Neutrophils 62 %   Lymphs 20 %   Monocytes 8 %   Eos 9 %   Basos 1 %   Neutrophils Absolute 6.1 1.4 - 7.0 x10E3/uL   Lymphocytes Absolute 1.9 0.7 - 3.1 x10E3/uL   Monocytes Absolute 0.7 0.1 - 0.9 x10E3/uL   EOS (ABSOLUTE) 0.9 (H) 0.0 - 0.4 x10E3/uL   Basophils Absolute 0.1 0.0 - 0.2 x10E3/uL   Immature Granulocytes 0 %   Immature Grans (Abs) 0.0 0.0 - 0.1 x10E3/uL  Comprehensive metabolic panel  Result Value Ref Range   Glucose 122 (H) 65 - 99 mg/dL   BUN 20 8 - 27 mg/dL   Creatinine, Ser 0.93 0.57 - 1.00 mg/dL   GFR calc non Af Amer 62 >59 mL/min/1.73   GFR calc Af Amer 71 >59 mL/min/1.73   BUN/Creatinine Ratio 22 11 - 26   Sodium 145 (H) 134 - 144 mmol/L   Potassium 4.8 3.5 - 5.2 mmol/L   Chloride 103 97 - 108 mmol/L   CO2 24 18 - 29 mmol/L   Calcium 9.5 8.7 - 10.3 mg/dL   Total Protein 6.4 6.0 - 8.5 g/dL   Albumin 4.1 3.5 - 4.8 g/dL   Globulin, Total 2.3 1.5 - 4.5 g/dL   Albumin/Globulin Ratio 1.8 1.1 - 2.5   Bilirubin Total <0.2 0.0 - 1.2 mg/dL   Alkaline Phosphatase 139 (H) 39 - 117 IU/L   AST 12 0 - 40 IU/L   ALT 7 0 - 32 IU/L      Assessment & Plan:   Problem List Items Addressed This Visit      Unprioritized   Insomnia    Letter to be away fro the road to help with sleep      Allergic rhinitis    Start Flonase       Other Visit Diagnoses    Immunization due    -  Primary    Relevant Orders    Flu Vaccine QUAD 36+ mos PF IM (Fluarix & Fluzone Quad PF) (Completed)        Follow up plan: Return in about 3 months (around 11/23/2015).

## 2015-08-24 NOTE — Assessment & Plan Note (Signed)
Start Flonase.

## 2015-08-24 NOTE — Assessment & Plan Note (Signed)
Letter to be away fro the road to help with sleep

## 2015-12-04 ENCOUNTER — Encounter: Payer: Self-pay | Admitting: Unknown Physician Specialty

## 2015-12-04 ENCOUNTER — Ambulatory Visit (INDEPENDENT_AMBULATORY_CARE_PROVIDER_SITE_OTHER): Payer: Commercial Managed Care - HMO | Admitting: Unknown Physician Specialty

## 2015-12-04 VITALS — BP 125/77 | HR 101 | Temp 97.9°F | Ht 61.0 in | Wt 124.2 lb

## 2015-12-04 DIAGNOSIS — G4700 Insomnia, unspecified: Secondary | ICD-10-CM | POA: Diagnosis not present

## 2015-12-04 DIAGNOSIS — J309 Allergic rhinitis, unspecified: Secondary | ICD-10-CM | POA: Diagnosis not present

## 2015-12-04 MED ORDER — ZALEPLON 5 MG PO CAPS: 5.0000 mg | ORAL_CAPSULE | Freq: Every evening | ORAL | Status: DC | PRN

## 2015-12-04 MED ORDER — MONTELUKAST SODIUM 10 MG PO TABS: 10.0000 mg | ORAL_TABLET | Freq: Every day | ORAL | Status: DC

## 2015-12-04 MED ORDER — IPRATROPIUM BROMIDE 0.06 % NA SOLN: 2.0000 | Freq: Four times a day (QID) | NASAL | Status: DC

## 2015-12-04 NOTE — Assessment & Plan Note (Addendum)
Rx for Singulair and Atrovent nasal spray.  Refer to allergist

## 2015-12-04 NOTE — Progress Notes (Signed)
BP 125/77 mmHg  Pulse 101  Temp(Src) 97.9 F (36.6 C)  Ht 5\' 1"  (1.549 m)  Wt 124 lb 3.2 oz (56.337 kg)  BMI 23.48 kg/m2  SpO2 97%   Subjective:    Patient ID: Paula Krause, female    DOB: [birth year], 75 y.o.   MRN: [id number]  HPI: Paula Krause is a 75 y.o. female  Chief Complaint  Patient presents with  . Allergic Rhinitis     pt states she is having problems with her allergies   Pt with continuing complaints of severe sinus congestion.  States she is using Flonase sometimes twice a day and is not helping. She has trouble sleeping due to allergies and will be moving away from the noisy road she lives on.  She would like "some sleeping pills" but she does drink a caffeinated pepsi for her evening meal.  She blows her nose constantly and worse at night and in the AM.  She reports clear drainage.    HPI   Relevant past medical, surgical, family and social history reviewed and updated as indicated. Interim medical history since our last visit reviewed. Allergies and medications reviewed and updated.  Review of Systems  Constitutional: Negative.   Eyes: Negative.   Respiratory: Negative.   Cardiovascular: Negative.   Gastrointestinal: Negative.        Heartburn is stable on Omeprazole.    Endocrine: Negative.   Genitourinary: Negative.   Musculoskeletal: Negative.   Skin: Negative.   Allergic/Immunologic: Negative.   Neurological: Negative.   Hematological: Negative.   Psychiatric/Behavioral: Negative.     Per HPI unless specifically indicated above     Objective:    BP 125/77 mmHg  Pulse 101  Temp(Src) 97.9 F (36.6 C)  Ht 5\' 1"  (1.549 m)  Wt 124 lb 3.2 oz (56.337 kg)  BMI 23.48 kg/m2  SpO2 97%  Wt Readings from Last 3 Encounters:  12/04/15 124 lb 3.2 oz (56.337 kg)  08/24/15 123 lb 3.2 oz (55.883 kg)  07/13/15 124 lb 6.4 oz (56.427 kg)    Physical Exam  Constitutional: She is oriented to person, place, and time. She appears well-developed and  well-nourished. No distress.  HENT:  Head: Normocephalic and atraumatic.  Right Ear: Tympanic membrane and ear canal normal.  Left Ear: Tympanic membrane and ear canal normal.  Nose: Rhinorrhea present. Right sinus exhibits no maxillary sinus tenderness and no frontal sinus tenderness. Left sinus exhibits no maxillary sinus tenderness and no frontal sinus tenderness.  Mouth/Throat: Mucous membranes are normal. Posterior oropharyngeal erythema present.  Eyes: Conjunctivae and lids are normal. Right eye exhibits no discharge. Left eye exhibits no discharge. No scleral icterus.  Cardiovascular: Normal rate and regular rhythm.   Pulmonary/Chest: Effort normal and breath sounds normal. No respiratory distress.  Abdominal: Normal appearance. There is no splenomegaly or hepatomegaly.  Musculoskeletal: Normal range of motion.  Neurological: She is alert and oriented to person, place, and time.  Skin: Skin is intact. No rash noted. No pallor.  Psychiatric: She has a normal mood and affect. Her behavior is normal. Judgment and thought content normal.    Results for orders placed or performed in visit on 07/13/15  CBC with Differential/Platelet  Result Value Ref Range   WBC 9.8 3.4 - 10.8 x10E3/uL   RBC 4.66 3.77 - 5.28 x10E6/uL   Hemoglobin 13.4 11.1 - 15.9 g/dL   Hematocrit 41.9 34.0 - 46.6 %   MCV 90 79 - 97 fL   MCH  28.8 26.6 - 33.0 pg   MCHC 32.0 31.5 - 35.7 g/dL   RDW 14.3 12.3 - 15.4 %   Platelets 356 150 - 379 x10E3/uL   Neutrophils 62 %   Lymphs 20 %   Monocytes 8 %   Eos 9 %   Basos 1 %   Neutrophils Absolute 6.1 1.4 - 7.0 x10E3/uL   Lymphocytes Absolute 1.9 0.7 - 3.1 x10E3/uL   Monocytes Absolute 0.7 0.1 - 0.9 x10E3/uL   EOS (ABSOLUTE) 0.9 (H) 0.0 - 0.4 x10E3/uL   Basophils Absolute 0.1 0.0 - 0.2 x10E3/uL   Immature Granulocytes 0 %   Immature Grans (Abs) 0.0 0.0 - 0.1 x10E3/uL  Comprehensive metabolic panel  Result Value Ref Range   Glucose 122 (H) 65 - 99 mg/dL   BUN 20  8 - 27 mg/dL   Creatinine, Ser 0.93 0.57 - 1.00 mg/dL   GFR calc non Af Amer 62 >59 mL/min/1.73   GFR calc Af Amer 71 >59 mL/min/1.73   BUN/Creatinine Ratio 22 11 - 26   Sodium 145 (H) 134 - 144 mmol/L   Potassium 4.8 3.5 - 5.2 mmol/L   Chloride 103 97 - 108 mmol/L   CO2 24 18 - 29 mmol/L   Calcium 9.5 8.7 - 10.3 mg/dL   Total Protein 6.4 6.0 - 8.5 g/dL   Albumin 4.1 3.5 - 4.8 g/dL   Globulin, Total 2.3 1.5 - 4.5 g/dL   Albumin/Globulin Ratio 1.8 1.1 - 2.5   Bilirubin Total <0.2 0.0 - 1.2 mg/dL   Alkaline Phosphatase 139 (H) 39 - 117 IU/L   AST 12 0 - 40 IU/L   ALT 7 0 - 32 IU/L      Assessment & Plan:   Problem List Items Addressed This Visit      Unprioritized   Insomnia    Discussed sleep hygeine, caffeine in particular.  I suspect allergies is a big contributing factor.  Will give a limited prescription of Sonata      Allergic rhinitis - Primary    Rx for Singulair and Atrovent nasal spray.  Refer to allergist      Relevant Medications   ipratropium (ATROVENT) 0.06 % nasal spray   montelukast (SINGULAIR) 10 MG tablet   Other Relevant Orders   Ambulatory referral to Allergy       Follow up plan: Return in about 3 weeks (around 12/25/2015).

## 2015-12-04 NOTE — Assessment & Plan Note (Addendum)
Discussed sleep hygeine, caffeine in particular.  I suspect allergies is a big contributing factor.  Will give a limited prescription of Sonata

## 2015-12-15 ENCOUNTER — Encounter: Payer: Self-pay | Admitting: Emergency Medicine

## 2015-12-15 ENCOUNTER — Emergency Department
Admission: EM | Admit: 2015-12-15 | Discharge: 2015-12-15 | Disposition: A | Discharge status: Home / Self Care | Payer: Commercial Managed Care - HMO | Attending: Emergency Medicine | Admitting: Emergency Medicine

## 2015-12-15 DIAGNOSIS — R197 Diarrhea, unspecified: Secondary | ICD-10-CM | POA: Insufficient documentation

## 2015-12-15 DIAGNOSIS — K921 Melena: Secondary | ICD-10-CM | POA: Diagnosis present

## 2015-12-15 DIAGNOSIS — Z79899 Other long term (current) drug therapy: Secondary | ICD-10-CM | POA: Insufficient documentation

## 2015-12-15 DIAGNOSIS — Z7951 Long term (current) use of inhaled steroids: Secondary | ICD-10-CM | POA: Insufficient documentation

## 2015-12-15 DIAGNOSIS — F17210 Nicotine dependence, cigarettes, uncomplicated: Secondary | ICD-10-CM | POA: Diagnosis not present

## 2015-12-15 LAB — BASIC METABOLIC PANEL
Anion gap: 6 (ref 5–15)
BUN: 10 mg/dL (ref 6–20)
CALCIUM: 8.9 mg/dL (ref 8.9–10.3)
CHLORIDE: 106 mmol/L (ref 101–111)
CO2: 27 mmol/L (ref 22–32)
CREATININE: 0.73 mg/dL (ref 0.44–1.00)
Glucose, Bld: 86 mg/dL (ref 65–99)
Potassium: 3.9 mmol/L (ref 3.5–5.1)
SODIUM: 139 mmol/L (ref 135–145)

## 2015-12-15 LAB — CBC
HCT: 40.3 % (ref 35.0–47.0)
HEMOGLOBIN: 12.9 g/dL (ref 12.0–16.0)
MCH: 28.8 pg (ref 26.0–34.0)
MCHC: 32.1 g/dL (ref 32.0–36.0)
MCV: 89.5 fL (ref 80.0–100.0)
PLATELETS: 283 10*3/uL (ref 150–440)
RBC: 4.5 MIL/uL (ref 3.80–5.20)
RDW: 14.5 % (ref 11.5–14.5)
WBC: 11 10*3/uL (ref 3.6–11.0)

## 2015-12-15 NOTE — ED Notes (Signed)
Pt in via triage w/ complaints of black stools x 3 days; pt reports episodes of diarrhea and an "upset stomach" as well.  Pt A/Ox4, vitals WDL, no immediate distress at this time.  PA student at bedside.

## 2015-12-15 NOTE — Discharge Instructions (Signed)
Diarrhea Diarrhea is frequent loose and watery bowel movements. It can cause you to feel weak and dehydrated. Dehydration can cause you to become tired and thirsty, have a dry mouth, and have decreased urination that often is dark yellow. Diarrhea is a sign of another problem, most often an infection that will not last long. In most cases, diarrhea typically lasts 2-3 days. However, it can last longer if it is a sign of something more serious. It is important to treat your diarrhea as directed by your caregiver to lessen or prevent future episodes of diarrhea. CAUSES  Some common causes include:  Gastrointestinal infections caused by viruses, bacteria, or parasites.  Food poisoning or food allergies.  Certain medicines, such as antibiotics, chemotherapy, and laxatives.  Artificial sweeteners and fructose.  Digestive disorders. HOME CARE INSTRUCTIONS  Ensure adequate fluid intake (hydration): Have 1 cup (8 oz) of fluid for each diarrhea episode. Avoid fluids that contain simple sugars or sports drinks, fruit juices, whole milk products, and sodas. Your urine should be clear or pale yellow if you are drinking enough fluids. Hydrate with an oral rehydration solution that you can purchase at pharmacies, retail stores, and online. You can prepare an oral rehydration solution at home by mixing the following ingredients together:   - tsp table salt.   tsp baking soda.   tsp salt substitute containing potassium chloride.  1  tablespoons sugar.  1 L (34 oz) of water.  Certain foods and beverages may increase the speed at which food moves through the gastrointestinal (GI) tract. These foods and beverages should be avoided and include:  Caffeinated and alcoholic beverages.  High-fiber foods, such as raw fruits and vegetables, nuts, seeds, and whole grain breads and cereals.  Foods and beverages sweetened with sugar alcohols, such as xylitol, sorbitol, and mannitol.  Some foods may be well  tolerated and may help thicken stool including:  Starchy foods, such as rice, toast, pasta, low-sugar cereal, oatmeal, grits, baked potatoes, crackers, and bagels.  Bananas.  Applesauce.  Add probiotic-rich foods to help increase healthy bacteria in the GI tract, such as yogurt and fermented milk products.  Wash your hands well after each diarrhea episode.  Only take over-the-counter or prescription medicines as directed by your caregiver.  Take a warm bath to relieve any burning or pain from frequent diarrhea episodes. SEEK IMMEDIATE MEDICAL CARE IF:   You are unable to keep fluids down.  You have persistent vomiting.  You have blood in your stool, or your stools are black and tarry.  You do not urinate in 6-8 hours, or there is only a small amount of very dark urine.  You have abdominal pain that increases or localizes.  You have weakness, dizziness, confusion, or light-headedness.  You have a severe headache.  Your diarrhea gets worse or does not get better.  You have a fever or persistent symptoms for more than 2-3 days.  You have a fever and your symptoms suddenly get worse. MAKE SURE YOU:   Understand these instructions.  Will watch your condition.  Will get help right away if you are not doing well or get worse.   This information is not intended to replace advice given to you by your health care provider. Make sure you discuss any questions you have with your health care provider.   Document Released: 11/04/2002 Document Revised: 12/05/2014 Document Reviewed: 07/22/2012 Elsevier Interactive Patient Education [employer].  Please return immediately if condition worsens. Please contact her primary physician  or the physician you were given for referral. If you have any specialist physicians involved in her treatment and plan please also contact them. Thank you for using [facility]. Please return for fever, obvious blood in the  stool, feel lightheaded, pass out, or any other new concerns.

## 2015-12-15 NOTE — ED Notes (Signed)
Pt started with black stools 3 days ago. Denies pain, nausea, vomiting, or SHOB. Denies any pain. Called doctor and he told her to come here.

## 2015-12-15 NOTE — ED Provider Notes (Signed)
Time Seen: Approximately 1845 I have reviewed the triage notes  Chief Complaint: Melena   History of Present Illness: Paula Krause is a 75 y.o. female who presents with a 2-3 day history of loose dark appearing stool. States proximally 4-6 bowel movements per day. She states she's taken some Imodium over-the-counter. She denies any nausea, vomiting. She called her primary physician due to the dark appearance of her stool was referred here to the emergency department for further evaluation. She states that she's never had a colonoscopy. She denies any unusual weight loss or night sweats. She does have a heavy smoking history of a pack per day for 50 years. Any abdominal pain, dysuria, urinary frequency, etc. She states she does not take iron tablets or Pepto-Bismol.  Past Medical History  Diagnosis Date  . Asthma   . COPD (chronic obstructive pulmonary disease) ([facility])   . Depression   . Anxiety   . Esophagitis   . GERD (gastroesophageal reflux disease)   . Insomnia   . Osteoporosis   . Tobacco abuse   . Cataract     right eye    Patient Active Problem List   Diagnosis Date Noted  . Allergic rhinitis 08/24/2015  . Asthma   . COPD, severe ([place])   . Depression   . Anxiety   . Esophagitis   . GERD (gastroesophageal reflux disease)   . Insomnia   . Osteoporosis   . Tobacco abuse     Past Surgical History  Procedure Laterality Date  . Abdominal hysterectomy    . 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 Abdominal hysterectomy    . 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 amitriptyline (ELAVIL) 50 MG tablet   Oral   Take 4 tablets (200 mg total) by mouth at bedtime.   120 tablet   6   . fluticasone (FLONASE) 50 MCG/ACT nasal spray   Each Nare   Place 2 sprays into both nostrils daily.   16 g   6   . ipratropium (ATROVENT) 0.06 % nasal spray   Each  Nare   Place 2 sprays into both nostrils 4 (four) times daily.   15 mL   12   . montelukast (SINGULAIR) 10 MG tablet   Oral   Take 1 tablet (10 mg total) by mouth at bedtime.   30 tablet   3   . omeprazole (PRILOSEC) 20 MG capsule   Oral   Take 1 capsule (20 mg total) by mouth daily.   30 capsule   12   . zaleplon (SONATA) 5 MG capsule   Oral   Take 1 capsule (5 mg total) by mouth at bedtime as needed for sleep.   30 capsule   0     Allergies:  Review of patient's allergies indicates no known allergies.  Family History: Family History  Problem Relation Age of Onset  . Diabetes Father   . Cancer Father     brain  . Hyperlipidemia Mother   . Dementia Mother     Social History: Social History  Substance Use Topics  . Smoking status: Current Every Day Smoker -- 1.00 packs/day    Types: Cigarettes  . Smokeless tobacco: Never Used  . Alcohol Use: No     Review of Systems:   10 point review of  systems was performed and was otherwise negative:  Constitutional: No fever Eyes: No visual disturbances ENT: No sore throat, ear pain Cardiac: No chest pain Respiratory: No shortness of breath, wheezing, or stridor Abdomen: No abdominal pain, no vomiting, No diarrhea Endocrine: No weight loss, No night sweats Extremities: No peripheral edema, cyanosis Skin: No rashes, easy bruising Neurologic: No focal weakness, trouble with speech or swollowing Urologic: No dysuria, Hematuria, or urinary frequency   Physical Exam:  ED Triage Vitals  Enc Vitals Group     BP 12/15/15 1532 106/66 mmHg     Pulse Rate 12/15/15 1532 94     Resp 12/15/15 1532 18     Temp 12/15/15 1532 97.4 F (36.3 C)     Temp Source 12/15/15 1532 Oral     SpO2 12/15/15 1532 98 %     Weight 12/15/15 1532 124 lb (56.246 kg)     Height 12/15/15 1532 5\' 3"  (1.6 m)     Head Cir --      Peak Flow --      Pain Score --      Pain Loc --      Pain Edu? --      Excl. in [place]? --     General: Awake  , Alert , and Oriented times 3; GCS 15 Head: Normal cephalic , atraumatic Eyes: Pupils equal , round, reactive to light Nose/Throat: No nasal drainage, patent upper airway without erythema or exudate.  Neck: Supple, Full range of motion, No anterior adenopathy or palpable thyroid masses Lungs: Clear to ascultation without wheezes , rhonchi, or rales Heart: Regular rate, regular rhythm without murmurs , gallops , or rubs Abdomen: Soft, non tender without rebound, guarding , or rigidity; bowel sounds positive and symmetric in all 4 quadrants. No organomegaly .        Extremities: 2 plus symmetric pulses. No edema, clubbing or cyanosis Neurologic: normal ambulation, Motor symmetric without deficits, sensory intact Skin: warm, dry, no rashes Rectal exam with chaperone present was guaiac negative with normal sphincter tone  Labs:   All laboratory work was reviewed including any pertinent negatives or positives listed below:  Labs Reviewed  [place]   reviewed the patient's laboratory work showed no significant findings    ED Course: Patient's stay here was uneventful and would discuss further outpatient testing through her primary physician which would include a colonoscopy. Patient's 75 years old and states that she's never had a colonoscopy. She does not appear to have infectious diarrhea based on the number of bowel movements per day and sexually showing some improvement with her Imodium. She was advised to return here if she has increased chest pain or abdominal pain, obvious bloody stool, or any other new concerns. All questions and concerns were addressed at the bedside and the patient was discharged in stable condition.    Assessment:  Diarrhea Dark stools which are guaiac negative   Final Clinical Impression:  Final diagnoses:  Diarrhea, unspecified type     Plan: * Outpatient management Patient was advised to return immediately if condition worsens.  Patient was advised to follow up with their primary care physician or other specialized physicians involved in their outpatient care             Daymon Larsen, MD 12/15/15 [place]

## 2015-12-25 ENCOUNTER — Encounter: Payer: Self-pay | Admitting: Unknown Physician Specialty

## 2015-12-25 ENCOUNTER — Ambulatory Visit (INDEPENDENT_AMBULATORY_CARE_PROVIDER_SITE_OTHER): Payer: Commercial Managed Care - HMO | Admitting: Unknown Physician Specialty

## 2015-12-25 VITALS — BP 108/66 | HR 103 | Temp 97.4°F | Ht 60.7 in | Wt 122.6 lb

## 2015-12-25 DIAGNOSIS — G4700 Insomnia, unspecified: Secondary | ICD-10-CM | POA: Diagnosis not present

## 2015-12-25 DIAGNOSIS — K921 Melena: Secondary | ICD-10-CM | POA: Diagnosis not present

## 2015-12-25 DIAGNOSIS — J309 Allergic rhinitis, unspecified: Secondary | ICD-10-CM

## 2015-12-25 MED ORDER — AMITRIPTYLINE HCL 50 MG PO TABS: 200.0000 mg | ORAL_TABLET | Freq: Every day | ORAL | Status: DC

## 2015-12-25 NOTE — Progress Notes (Signed)
-  BP 108/66 mmHg  Pulse 103  Temp(Src) 97.4 F (36.3 C)  Ht 5' 0.7" (1.542 m)  Wt 122 lb 9.6 oz (55.611 kg)  BMI 23.39 kg/m2  SpO2 96%   Subjective:    Patient ID: Paula Krause, female    DOB: [birth year], 75 y.o.   MRN: [id number]  HPI: Paula Krause is a 75 y.o. female  Chief Complaint  Patient presents with  . Allergic Rhinitis     pt is here for 3 week f/u  . Insomnia  . Melena    pt states she has been having black stools, went to ER and they did not find any blood. Pt would like colonoscopy   Allergic Rhinitis Pt was allergies.  Doesn't see any difference.  States an appointment was made with an allergist but can't go as it's out of town.  No improvement with Singulair and Atrovent Nasal spray.  No help with Flonase, Clariton, Allegra, or Zyrtec.    Insomnia Sleep is "not a lot better." Sonata helped and she now drinks decaf coffee in the evening.  She drinks 4-5 cups of coffee in the AM.  Sonata helped some.  She has been on the Amitriptyline for years.    Black stools Went to the ER for evaluation.  No blood noted.  She has not had a colonoscopy.  CBC as below  Relevant past medical, surgical, family and social history reviewed and updated as indicated. Interim medical history since our last visit reviewed. Allergies and medications reviewed and updated.  Review of Systems  Per HPI unless specifically indicated above     Objective:    BP 108/66 mmHg  Pulse 103  Temp(Src) 97.4 F (36.3 C)  Ht 5' 0.7" (1.542 m)  Wt 122 lb 9.6 oz (55.611 kg)  BMI 23.39 kg/m2  SpO2 96%  Wt Readings from Last 3 Encounters:  12/25/15 122 lb 9.6 oz (55.611 kg)  12/15/15 124 lb (56.246 kg)  12/04/15 124 lb 3.2 oz (56.337 kg)    Physical Exam  Constitutional: She is oriented to person, place, and time. She appears well-developed and well-nourished. No distress.  HENT:  Head: Normocephalic and atraumatic.  Eyes: Conjunctivae and lids are normal. Right eye exhibits no  discharge. Left eye exhibits no discharge. No scleral icterus.  Cardiovascular: Normal rate.   Pulmonary/Chest: Effort normal.  Abdominal: Normal appearance. There is no splenomegaly or hepatomegaly.  Musculoskeletal: Normal range of motion.  Neurological: She is alert and oriented to person, place, and time.  Skin: Skin is intact. No rash noted. No pallor.  Psychiatric: She has a normal mood and affect. Her behavior is normal. Judgment and thought content normal.    Results for orders placed or performed during the hospital encounter of 12/15/15  CBC  Result Value Ref Range   WBC 11.0 3.6 - 11.0 K/uL   RBC 4.50 3.80 - 5.20 MIL/uL   Hemoglobin 12.9 12.0 - 16.0 g/dL   HCT 40.3 35.0 - 47.0 %   MCV 89.5 80.0 - 100.0 fL   MCH 28.8 26.0 - 34.0 pg   MCHC 32.1 32.0 - 36.0 g/dL   RDW 14.5 11.5 - 14.5 %   Platelets 283 150 - 440 K/uL  Basic metabolic panel  Result Value Ref Range   Sodium 139 135 - 145 mmol/L   Potassium 3.9 3.5 - 5.1 mmol/L   Chloride 106 101 - 111 mmol/L   CO2 27 22 - 32 mmol/L   Glucose,  Bld 86 65 - 99 mg/dL   BUN 10 6 - 20 mg/dL   Creatinine, Ser 0.73 0.44 - 1.00 mg/dL   Calcium 8.9 8.9 - 10.3 mg/dL   GFR calc non Af Amer >60 >60 mL/min   GFR calc Af Amer >60 >60 mL/min   Anion gap 6 5 - 15      Assessment & Plan:   Problem List Items Addressed This Visit      Unprioritized   Insomnia - Primary    Still not good.  Pt will move soon and I still feel that her allergies are a contributing factor      Allergic rhinitis    Treatments have not worked.  ? Polyp.  Unable to see allergist.  Will refer to ENT      Relevant Orders   Ambulatory referral to ENT   Black stools   Relevant Orders   Ambulatory referral to Gastroenterology       Follow up plan: Return in about 6 months (around 06/23/2016).

## 2015-12-25 NOTE — Assessment & Plan Note (Signed)
Still not good.  Pt will move soon and I still feel that her allergies are a contributing factor

## 2015-12-25 NOTE — Assessment & Plan Note (Signed)
Treatments have not worked.  ? Polyp.  Unable to see allergist.  Will refer to ENT

## 2015-12-30 ENCOUNTER — Telehealth: Payer: Self-pay

## 2015-12-30 ENCOUNTER — Other Ambulatory Visit: Payer: Self-pay

## 2015-12-30 DIAGNOSIS — Z1211 Encounter for screening for malignant neoplasm of colon: Secondary | ICD-10-CM

## 2015-12-30 MED ORDER — PEG 3350-KCL-NABCB-NACL-NASULF 236 G PO SOLR: 4000.0000 mL | Freq: Once | ORAL | Status: DC

## 2015-12-30 NOTE — Telephone Encounter (Signed)
Gastroenterology Pre-Procedure Review  Request Date: 01/15/16 Requesting Physician: Kathrine Haddock, NP  PATIENT REVIEW QUESTIONS: The patient responded to the following health history questions as indicated:    1. Are you having any GI issues? no 2. Do you have a personal history of Polyps? no 3. Do you have a family history of Colon Cancer or Polyps? no 4. Diabetes Mellitus? no 5. Joint replacements in the past 12 months?no 6. Major health problems in the past 3 months?no 7. Any artificial heart valves, MVP, or defibrillator?no    MEDICATIONS & ALLERGIES:    Patient reports the following regarding taking any anticoagulation/antiplatelet therapy:   Plavix, Coumadin, Eliquis, Xarelto, Lovenox, Pradaxa, Brilinta, or Effient? no Aspirin? no  Patient confirms/reports the following medications:  Current Outpatient Prescriptions  Medication Sig Dispense Refill  . amitriptyline (ELAVIL) 50 MG tablet Take 4 tablets (200 mg total) by mouth at bedtime. 120 tablet 6  . fluticasone (FLONASE) 50 MCG/ACT nasal spray Place 2 sprays into both nostrils daily. 16 g 6  . ipratropium (ATROVENT) 0.06 % nasal spray Place 2 sprays into both nostrils 4 (four) times daily. 15 mL 12  . montelukast (SINGULAIR) 10 MG tablet Take 1 tablet (10 mg total) by mouth at bedtime. 30 tablet 3  . omeprazole (PRILOSEC) 20 MG capsule Take 1 capsule (20 mg total) by mouth daily. 30 capsule 12  . zaleplon (SONATA) 5 MG capsule Take 1 capsule (5 mg total) by mouth at bedtime as needed for sleep. 30 capsule 0   No current facility-administered medications for this visit.    Patient confirms/reports the following allergies:  No Known Allergies  No orders of the defined types were placed in this encounter.    AUTHORIZATION INFORMATION Primary Insurance: 1D#: Group #:  Secondary Insurance: 1D#: Group #:  SCHEDULE INFORMATION: Date: 01/15/16 Time: Location: [place]

## 2016-01-11 ENCOUNTER — Encounter: Payer: Self-pay | Admitting: *Deleted

## 2016-01-12 ENCOUNTER — Telehealth: Payer: Self-pay

## 2016-01-12 MED ORDER — NA SULFATE-K SULFATE-MG SULF 17.5-3.13-1.6 GM/177ML PO SOLN: 1.0000 | ORAL | Status: DC

## 2016-01-12 NOTE — Telephone Encounter (Signed)
Patient's granddaughter Paula Krause called stating that her grandmother was given Golytely to clean her colon so she could prepare for her colonoscopy. I explained to her how her grandmother should take her medication. Brandy understood the instructions. However, she was wanting for [place] to call in [place] since she had the instructions in hand and was easier for her grandmother to follow. I told her that I would e-scribe it. Paula Krause stated that she would go and pick it up.

## 2016-01-13 NOTE — Discharge Instructions (Signed)

## 2016-01-15 ENCOUNTER — Encounter
Admission: RE | Disposition: A | Discharge status: Home / Self Care | Payer: Self-pay | Source: Ambulatory Visit | Attending: Gastroenterology

## 2016-01-15 ENCOUNTER — Ambulatory Visit
Admission: RE | Admit: 2016-01-15 | Discharge: 2016-01-15 | Disposition: A | Discharge status: Home / Self Care | Payer: Commercial Managed Care - HMO | Source: Ambulatory Visit | Attending: Gastroenterology | Admitting: Gastroenterology

## 2016-01-15 ENCOUNTER — Ambulatory Visit: Payer: Commercial Managed Care - HMO | Admitting: Anesthesiology

## 2016-01-15 DIAGNOSIS — Z833 Family history of diabetes mellitus: Secondary | ICD-10-CM | POA: Diagnosis not present

## 2016-01-15 DIAGNOSIS — D125 Benign neoplasm of sigmoid colon: Secondary | ICD-10-CM | POA: Diagnosis not present

## 2016-01-15 DIAGNOSIS — F329 Major depressive disorder, single episode, unspecified: Secondary | ICD-10-CM | POA: Diagnosis not present

## 2016-01-15 DIAGNOSIS — K641 Second degree hemorrhoids: Secondary | ICD-10-CM | POA: Insufficient documentation

## 2016-01-15 DIAGNOSIS — J449 Chronic obstructive pulmonary disease, unspecified: Secondary | ICD-10-CM | POA: Diagnosis not present

## 2016-01-15 DIAGNOSIS — K219 Gastro-esophageal reflux disease without esophagitis: Secondary | ICD-10-CM | POA: Insufficient documentation

## 2016-01-15 DIAGNOSIS — Z9981 Dependence on supplemental oxygen: Secondary | ICD-10-CM | POA: Diagnosis not present

## 2016-01-15 DIAGNOSIS — F419 Anxiety disorder, unspecified: Secondary | ICD-10-CM | POA: Diagnosis not present

## 2016-01-15 DIAGNOSIS — Z1211 Encounter for screening for malignant neoplasm of colon: Secondary | ICD-10-CM | POA: Diagnosis not present

## 2016-01-15 DIAGNOSIS — F17210 Nicotine dependence, cigarettes, uncomplicated: Secondary | ICD-10-CM | POA: Diagnosis not present

## 2016-01-15 DIAGNOSIS — Z808 Family history of malignant neoplasm of other organs or systems: Secondary | ICD-10-CM | POA: Insufficient documentation

## 2016-01-15 DIAGNOSIS — Z820 Family history of epilepsy and other diseases of the nervous system: Secondary | ICD-10-CM | POA: Diagnosis not present

## 2016-01-15 DIAGNOSIS — Z79899 Other long term (current) drug therapy: Secondary | ICD-10-CM | POA: Insufficient documentation

## 2016-01-15 DIAGNOSIS — G4700 Insomnia, unspecified: Secondary | ICD-10-CM | POA: Insufficient documentation

## 2016-01-15 DIAGNOSIS — R0600 Dyspnea, unspecified: Secondary | ICD-10-CM | POA: Insufficient documentation

## 2016-01-15 DIAGNOSIS — R0602 Shortness of breath: Secondary | ICD-10-CM | POA: Diagnosis not present

## 2016-01-15 DIAGNOSIS — K5730 Diverticulosis of large intestine without perforation or abscess without bleeding: Secondary | ICD-10-CM | POA: Diagnosis not present

## 2016-01-15 DIAGNOSIS — Z8249 Family history of ischemic heart disease and other diseases of the circulatory system: Secondary | ICD-10-CM | POA: Diagnosis not present

## 2016-01-15 DIAGNOSIS — Z90710 Acquired absence of both cervix and uterus: Secondary | ICD-10-CM | POA: Diagnosis not present

## 2016-01-15 HISTORY — DX: Presence of dental prosthetic device (complete) (partial): Z97.2

## 2016-01-15 HISTORY — DX: Reserved for inherently not codable concepts without codable children: IMO0001

## 2016-01-15 HISTORY — PX: POLYPECTOMY: SHX5525

## 2016-01-15 HISTORY — PX: COLONOSCOPY WITH PROPOFOL: SHX5780

## 2016-01-15 SURGERY — COLONOSCOPY WITH PROPOFOL
Anesthesia: Monitor Anesthesia Care

## 2016-01-15 MED ORDER — LACTATED RINGERS IV SOLN
INTRAVENOUS | Status: DC
  Administered 2016-01-15: 09:00:00 via INTRAVENOUS

## 2016-01-15 MED ORDER — LIDOCAINE HCL (CARDIAC) 20 MG/ML IV SOLN
INTRAVENOUS | Status: DC | PRN
  Administered 2016-01-15: 30 mg via INTRAVENOUS

## 2016-01-15 MED ORDER — STERILE WATER FOR IRRIGATION IR SOLN
Status: DC | PRN
  Administered 2016-01-15: 10:00:00

## 2016-01-15 MED ORDER — PROPOFOL 10 MG/ML IV BOLUS
INTRAVENOUS | Status: DC | PRN
  Administered 2016-01-15: 80 mg via INTRAVENOUS
  Administered 2016-01-15: 40 mg via INTRAVENOUS
  Administered 2016-01-15: 20 mg via INTRAVENOUS
  Administered 2016-01-15 (×3): 40 mg via INTRAVENOUS
  Administered 2016-01-15: 20 mg via INTRAVENOUS

## 2016-01-15 SURGICAL SUPPLY — 28 items
CANISTER SUCT 1200ML W/VALVE (MISCELLANEOUS) ×4 IMPLANT
FCP ESCP3.2XJMB 240X2.8X (MISCELLANEOUS) ×2
FORCEPS BIOP RAD 4 LRG CAP 4 (CUTTING FORCEPS) IMPLANT
FORCEPS BIOP RJ4 240 W/NDL (MISCELLANEOUS) ×2
FORCEPS ESCP3.2XJMB 240X2.8X (MISCELLANEOUS) ×2 IMPLANT
GOWN CVR UNV OPN BCK APRN NK (MISCELLANEOUS) ×4 IMPLANT
GOWN ISOL THUMB LOOP REG UNIV (MISCELLANEOUS) ×4
HEMOCLIP INSTINCT (CLIP) IMPLANT
INJECTOR VARIJECT VIN23 (MISCELLANEOUS) IMPLANT
KIT CO2 TUBING (TUBING) IMPLANT
KIT DEFENDO VALVE AND CONN (KITS) IMPLANT
KIT ENDO PROCEDURE OLY (KITS) ×4 IMPLANT
LIGATOR MULTIBAND 6SHOOTER MBL (MISCELLANEOUS) IMPLANT
MARKER SPOT ENDO TATTOO 5ML (MISCELLANEOUS) IMPLANT
PAD GROUND ADULT SPLIT (MISCELLANEOUS) IMPLANT
SNARE SHORT THROW 13M SML OVAL (MISCELLANEOUS) IMPLANT
SNARE SHORT THROW 30M LRG OVAL (MISCELLANEOUS) IMPLANT
SPOT EX ENDOSCOPIC TATTOO (MISCELLANEOUS)
SUCTION POLY TRAP 4CHAMBER (MISCELLANEOUS) ×4 IMPLANT
TRAP SUCTION POLY (MISCELLANEOUS) IMPLANT
TUBING CONN 6MMX3.1M (TUBING)
TUBING SUCTION CONN 0.25 STRL (TUBING) IMPLANT
UNDERPAD 30X60 958B10 (PK) (MISCELLANEOUS) IMPLANT
VALVE BIOPSY ENDO (VALVE) IMPLANT
VARIJECT INJECTOR VIN23 (MISCELLANEOUS)
WATER AUXILLARY (MISCELLANEOUS) IMPLANT
WATER STERILE IRR 250ML POUR (IV SOLUTION) ×4 IMPLANT
WATER STERILE IRR 500ML POUR (IV SOLUTION) IMPLANT

## 2016-01-15 NOTE — Op Note (Signed)
[facility] Gastroenterology Patient Name: Paula Krause Procedure Date: 01/15/2016 10:05 AM MRN: [id number] Account #: [id number] Date of Birth: [birth year] Admit Type: Outpatient Age: 75 Room: [facility] OR ROOM 01 Gender: Female Note Status: Finalized Procedure:            Colonoscopy Indications:          Screening for colorectal malignant neoplasm Providers:            Lucilla Lame, MD Referring MD:         Kathrine Haddock, PA (Referring MD) Medicines:            Propofol per Anesthesia Complications:        No immediate complications. Procedure:            Pre-Anesthesia Assessment:                       - Prior to the procedure, a History and Physical was                        performed, and patient medications and allergies were                        reviewed. The patient's tolerance of previous                        anesthesia was also reviewed. The risks and benefits of                        the procedure and the sedation options and risks were                        discussed with the patient. All questions were                        answered, and informed consent was obtained. Prior                        Anticoagulants: The patient has taken no previous                        anticoagulant or antiplatelet agents. ASA Grade                        Assessment: II - A patient with mild systemic disease.                        After reviewing the risks and benefits, the patient was                        deemed in satisfactory condition to undergo the                        procedure.                       After obtaining informed consent, the colonoscope was                        passed under direct vision. Throughout the procedure,  the patient's blood pressure, pulse, and oxygen                        saturations were monitored continuously. The was                        introduced through the anus and advanced to the the               cecum, identified by appendiceal orifice and ileocecal                        valve. The patient tolerated the procedure well. The                        quality of the bowel preparation was good. Findings:      The perianal and digital rectal examinations were normal.      A 8 mm polyp was found in the sigmoid colon. The polyp was pedunculated.       The polyp was removed with a hot snare. Resection and retrieval were       complete.      Non-bleeding internal hemorrhoids were found during retroflexion. The       hemorrhoids were Grade II (internal hemorrhoids that prolapse but reduce       spontaneously).      Multiple small-mouthed diverticula were found in the sigmoid colon. Impression:           - One 8 mm polyp in the sigmoid colon, removed with a                        hot snare. Resected and retrieved.                       - Non-bleeding internal hemorrhoids.                       - Diverticulosis in the sigmoid colon. Recommendation:       - Await pathology results.                       - Repeat colonoscopy in 5 years if polyp adenoma and 10                        years if hyperplastic Procedure Code(s):    --- Professional ---                       [phone], Colonoscopy, flexible; with removal of tumor(s),                        polyp(s), or other lesion(s) by snare technique Diagnosis Code(s):    --- Professional ---                       Z12.11, Encounter for screening for malignant neoplasm                        of colon                       D12.5, Benign neoplasm of sigmoid colon CPT copyright 2016 American Medical Association. All rights reserved. The  codes documented in this report are preliminary and upon coder review may  be revised to meet current compliance requirements. Lucilla Lame, MD 01/15/2016 10:37:13 AM This report has been signed electronically. Number of Addenda: 0 Note Initiated On: 01/15/2016 10:05 AM Scope Withdrawal Time: 0 hours 5 minutes  34 seconds  Total Procedure Duration: 0 hours 15 minutes 36 seconds       [facility]

## 2016-01-15 NOTE — Anesthesia Postprocedure Evaluation (Signed)
Anesthesia Post Note  Patient: Paula Krause  Procedure(s) Performed: Procedure(s) (LRB): COLONOSCOPY WITH PROPOFOL (N/A) POLYPECTOMY  Patient location during evaluation: PACU Anesthesia Type: MAC Level of consciousness: awake and alert Pain management: pain level controlled Vital Signs Assessment: post-procedure vital signs reviewed and stable Respiratory status: spontaneous breathing, nonlabored ventilation, respiratory function stable and patient connected to nasal cannula oxygen Cardiovascular status: blood pressure returned to baseline and stable Postop Assessment: no signs of nausea or vomiting Anesthetic complications: no    Telecia Larocque

## 2016-01-15 NOTE — H&P (Signed)
[facility]  [address], [place], [place] [address] Phone: [phone] Fax : [phone]  Primary Care Physician:  Paula Haddock, NP Primary Gastroenterologist:  Dr. Allen Norris  Pre-Procedure History & Physical: HPI:  Paula Krause is a 75 y.o. female is here for a screening colonoscopy.   Past Medical History  Diagnosis Date  . Asthma   . COPD (chronic obstructive pulmonary disease) ([place])   . Depression   . Anxiety   . Esophagitis   . GERD (gastroesophageal reflux disease)   . Insomnia   . Osteoporosis   . Tobacco abuse   . Cataract     right eye  . Shortness of breath dyspnea   . Wears dentures     full upper, partial lower    Past Surgical History  Procedure Laterality Date  . Abdominal hysterectomy    . 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 Authorizing Provider  amitriptyline (ELAVIL) 50 MG tablet Take 4 tablets (200 mg total) by mouth at bedtime. 12/25/15  Yes Paula Haddock, NP  fluticasone (FLONASE) 50 MCG/ACT nasal spray Place 2 sprays into both nostrils daily. 08/24/15  Yes Paula Haddock, NP  mometasone-formoterol (DULERA) 100-5 MCG/ACT AERO Inhale 2 puffs into the lungs 2 (two) times daily. Pt only uses "as needed"   Yes Historical Provider, MD  Multiple Vitamins-Minerals (CENTRUM SILVER PO) Take by mouth daily.   Yes Historical Provider, MD  Multiple Vitamins-Minerals (HAIR/SKIN/NAILS PO) Take by mouth daily.   Yes Historical Provider, MD  Na Sulfate-K Sulfate-Mg Sulf (SUPREP BOWEL PREP) SOLN Take 1 kit by mouth as directed. 01/12/16  Yes Lucilla Lame, MD  omeprazole (PRILOSEC) 20 MG capsule Take 1 capsule (20 mg total) by mouth daily. 07/13/15  Yes Paula Haddock, NP  polyethylene glycol (GOLYTELY) 236 g solution Take 4,000 mLs by mouth once. Drink one 8oz glass every 30 mins until stools are clear. 12/30/15  Yes Cristol Engdahl Allen Norris, MD  montelukast (SINGULAIR) 10 MG tablet Take  1 tablet (10 mg total) by mouth at bedtime. Patient not taking: Reported on 01/15/2016 12/04/15   Paula Haddock, NP  zaleplon (SONATA) 5 MG capsule Take 1 capsule (5 mg total) by mouth at bedtime as needed for sleep. Patient not taking: Reported on 01/15/2016 12/04/15   Paula Haddock, NP    Allergies as of 12/30/2015  . (No Known Allergies)    Family History  Problem Relation Age of Onset  . Diabetes Father   . Cancer Father     brain  . Hyperlipidemia Mother   . Dementia Mother     Social History   Social History  . Marital Status: Married    Spouse Name: N/A  . Number of Children: N/A  . Years of Education: N/A   Occupational History  . Not on file.   Social History Main Topics  . Smoking status: Current Every Day Smoker -- 1.00 packs/day for 60 years    Types: Cigarettes  . Smokeless tobacco: Never Used  . Alcohol Use: No  . Drug Use: No  . Sexual Activity: No   Other Topics Concern  . Not on file   Social History Narrative    Review of Systems: See HPI, otherwise negative ROS  Physical Exam: BP 105/75 mmHg  Pulse 96  Temp(Src) 97.9 F (36.6 C) (Temporal)  Resp 16  Ht _0  (1.549 m)  Wt 120 lb (54.432 kg)  BMI 22.69 kg/m2  SpO2 91% General:   Alert,  pleasant and cooperative in NAD Head:  Normocephalic and atraumatic. Neck:  Supple; no masses or thyromegaly. Lungs:  Clear throughout to auscultation.    Heart:  Regular rate and rhythm. Abdomen:  Soft, nontender and nondistended. Normal bowel sounds, without guarding, and without rebound.   Neurologic:  Alert and  oriented x4;  grossly normal neurologically.  Impression/Plan: Paula Krause is now here to undergo a screening colonoscopy.  Risks, benefits, and alternatives regarding colonoscopy have been reviewed with the patient.  Questions have been answered.  All parties agreeable.

## 2016-01-15 NOTE — Transfer of Care (Signed)
Immediate Anesthesia Transfer of Care Note  Patient: Paula Krause  Procedure(s) Performed: Procedure(s): COLONOSCOPY WITH PROPOFOL (N/A) POLYPECTOMY  Patient Location: PACU  Anesthesia Type: MAC  Level of Consciousness: awake, alert  and patient cooperative  Airway and Oxygen Therapy: Patient Spontanous Breathing and Patient connected to supplemental oxygen  Post-op Assessment: Post-op Vital signs reviewed, Patient's Cardiovascular Status Stable, Respiratory Function Stable, Patent Airway and No signs of Nausea or vomiting  Post-op Vital Signs: Reviewed and stable  Complications: No apparent anesthesia complications

## 2016-01-15 NOTE — Anesthesia Procedure Notes (Signed)
Procedure Name: MAC Performed by: Jimmie Rueter Pre-anesthesia Checklist: Patient identified, Emergency Drugs available, Suction available, Patient being monitored and Timeout performed Patient Re-evaluated:Patient Re-evaluated prior to inductionOxygen Delivery Method: Nasal cannula       

## 2016-01-15 NOTE — Anesthesia Preprocedure Evaluation (Signed)
Anesthesia Evaluation  Patient identified by MRN, date of birth, ID band  Reviewed: NPO status   Airway Mallampati: II  TM Distance: >3 FB Neck ROM: full    Dental  (+) Upper Dentures, Lower Dentures   Pulmonary neg pulmonary ROS, COPD (oxygen at night; RA sats 91%),  oxygen dependent, Current Smoker,    Pulmonary exam normal        Cardiovascular Exercise Tolerance: Good negative cardio ROS Normal cardiovascular exam     Neuro/Psych Anxiety Brain aneurysm coiling 2014  negative neurological ROS     GI/Hepatic Neg liver ROS, GERD  Medicated,  Endo/Other  negative endocrine ROS  Renal/GU negative Renal ROS  negative genitourinary   Musculoskeletal   Abdominal   Peds  Hematology negative hematology ROS (+)   Anesthesia Other Findings Pt on amitrytiline.  Reproductive/Obstetrics                             Anesthesia Physical Anesthesia Plan  ASA: III  Anesthesia Plan: MAC   Post-op Pain Management:    Induction:   Airway Management Planned:   Additional Equipment:   Intra-op Plan:   Post-operative Plan:   Informed Consent: I have reviewed the patients History and Physical, chart, labs and discussed the procedure including the risks, benefits and alternatives for the proposed anesthesia with the patient or authorized representative who has indicated his/her understanding and acceptance.     Plan Discussed with: CRNA  Anesthesia Plan Comments:         Anesthesia Quick Evaluation

## 2016-01-18 ENCOUNTER — Encounter: Payer: Self-pay | Admitting: Gastroenterology

## 2016-01-19 ENCOUNTER — Encounter: Payer: Self-pay | Admitting: Gastroenterology

## 2016-01-20 ENCOUNTER — Encounter: Payer: Self-pay | Admitting: Gastroenterology

## 2016-02-23 ENCOUNTER — Inpatient Hospital Stay
Admission: EM | Admit: 2016-02-23 | Discharge: 2016-02-28 | DRG: 871 | Disposition: A | Discharge status: Other Acute Inpatient Hospital | Payer: Commercial Managed Care - HMO | Attending: Internal Medicine | Admitting: Internal Medicine

## 2016-02-23 ENCOUNTER — Emergency Department: Payer: Commercial Managed Care - HMO

## 2016-02-23 ENCOUNTER — Encounter: Payer: Self-pay | Admitting: Emergency Medicine

## 2016-02-23 DIAGNOSIS — E860 Dehydration: Secondary | ICD-10-CM | POA: Diagnosis present

## 2016-02-23 DIAGNOSIS — J432 Centrilobular emphysema: Secondary | ICD-10-CM | POA: Diagnosis not present

## 2016-02-23 DIAGNOSIS — E46 Unspecified protein-calorie malnutrition: Secondary | ICD-10-CM | POA: Diagnosis present

## 2016-02-23 DIAGNOSIS — A419 Sepsis, unspecified organism: Secondary | ICD-10-CM | POA: Diagnosis present

## 2016-02-23 DIAGNOSIS — F419 Anxiety disorder, unspecified: Secondary | ICD-10-CM | POA: Diagnosis present

## 2016-02-23 DIAGNOSIS — G4700 Insomnia, unspecified: Secondary | ICD-10-CM | POA: Diagnosis present

## 2016-02-23 DIAGNOSIS — F329 Major depressive disorder, single episode, unspecified: Secondary | ICD-10-CM | POA: Diagnosis present

## 2016-02-23 DIAGNOSIS — N170 Acute kidney failure with tubular necrosis: Secondary | ICD-10-CM | POA: Diagnosis present

## 2016-02-23 DIAGNOSIS — Z7951 Long term (current) use of inhaled steroids: Secondary | ICD-10-CM

## 2016-02-23 DIAGNOSIS — I959 Hypotension, unspecified: Secondary | ICD-10-CM | POA: Diagnosis not present

## 2016-02-23 DIAGNOSIS — Z833 Family history of diabetes mellitus: Secondary | ICD-10-CM | POA: Diagnosis not present

## 2016-02-23 DIAGNOSIS — R1011 Right upper quadrant pain: Secondary | ICD-10-CM | POA: Diagnosis present

## 2016-02-23 DIAGNOSIS — I480 Paroxysmal atrial fibrillation: Secondary | ICD-10-CM | POA: Diagnosis present

## 2016-02-23 DIAGNOSIS — R6521 Severe sepsis with septic shock: Secondary | ICD-10-CM | POA: Diagnosis present

## 2016-02-23 DIAGNOSIS — Z6822 Body mass index (BMI) 22.0-22.9, adult: Secondary | ICD-10-CM | POA: Diagnosis not present

## 2016-02-23 DIAGNOSIS — M810 Age-related osteoporosis without current pathological fracture: Secondary | ICD-10-CM | POA: Diagnosis present

## 2016-02-23 DIAGNOSIS — F17200 Nicotine dependence, unspecified, uncomplicated: Secondary | ICD-10-CM | POA: Insufficient documentation

## 2016-02-23 DIAGNOSIS — A403 Sepsis due to Streptococcus pneumoniae: Secondary | ICD-10-CM | POA: Diagnosis present

## 2016-02-23 DIAGNOSIS — J440 Chronic obstructive pulmonary disease with acute lower respiratory infection: Secondary | ICD-10-CM | POA: Diagnosis present

## 2016-02-23 DIAGNOSIS — J441 Chronic obstructive pulmonary disease with (acute) exacerbation: Secondary | ICD-10-CM | POA: Diagnosis present

## 2016-02-23 DIAGNOSIS — K219 Gastro-esophageal reflux disease without esophagitis: Secondary | ICD-10-CM | POA: Diagnosis present

## 2016-02-23 DIAGNOSIS — I4891 Unspecified atrial fibrillation: Secondary | ICD-10-CM

## 2016-02-23 DIAGNOSIS — J189 Pneumonia, unspecified organism: Secondary | ICD-10-CM | POA: Insufficient documentation

## 2016-02-23 DIAGNOSIS — H269 Unspecified cataract: Secondary | ICD-10-CM | POA: Diagnosis present

## 2016-02-23 DIAGNOSIS — I4892 Unspecified atrial flutter: Secondary | ICD-10-CM | POA: Diagnosis present

## 2016-02-23 DIAGNOSIS — J45909 Unspecified asthma, uncomplicated: Secondary | ICD-10-CM | POA: Diagnosis present

## 2016-02-23 DIAGNOSIS — I248 Other forms of acute ischemic heart disease: Secondary | ICD-10-CM | POA: Diagnosis present

## 2016-02-23 DIAGNOSIS — F17210 Nicotine dependence, cigarettes, uncomplicated: Secondary | ICD-10-CM | POA: Diagnosis present

## 2016-02-23 DIAGNOSIS — Z809 Family history of malignant neoplasm, unspecified: Secondary | ICD-10-CM

## 2016-02-23 DIAGNOSIS — F22 Delusional disorders: Secondary | ICD-10-CM

## 2016-02-23 DIAGNOSIS — R0602 Shortness of breath: Secondary | ICD-10-CM

## 2016-02-23 DIAGNOSIS — J90 Pleural effusion, not elsewhere classified: Secondary | ICD-10-CM

## 2016-02-23 DIAGNOSIS — R0902 Hypoxemia: Secondary | ICD-10-CM

## 2016-02-23 LAB — COMPREHENSIVE METABOLIC PANEL
ALK PHOS: 168 U/L — AB (ref 38–126)
ALT: 33 U/L (ref 14–54)
AST: 50 U/L — ABNORMAL HIGH (ref 15–41)
Albumin: 2.9 g/dL — ABNORMAL LOW (ref 3.5–5.0)
Anion gap: 15 (ref 5–15)
BUN: 54 mg/dL — ABNORMAL HIGH (ref 6–20)
CALCIUM: 9.3 mg/dL (ref 8.9–10.3)
CO2: 18 mmol/L — AB (ref 22–32)
CREATININE: 1.41 mg/dL — AB (ref 0.44–1.00)
Chloride: 101 mmol/L (ref 101–111)
GFR, EST AFRICAN AMERICAN: 42 mL/min — AB (ref >60)
GFR, EST NON AFRICAN AMERICAN: 36 mL/min — AB (ref >60)
Glucose, Bld: 151 mg/dL — ABNORMAL HIGH (ref 65–99)
Potassium: 3.3 mmol/L — ABNORMAL LOW (ref 3.5–5.1)
Sodium: 134 mmol/L — ABNORMAL LOW (ref 135–145)
Total Bilirubin: 1.2 mg/dL (ref 0.3–1.2)
Total Protein: 7.2 g/dL (ref 6.5–8.1)

## 2016-02-23 LAB — CBC WITH DIFFERENTIAL/PLATELET
Basophils Absolute: 0 10*3/uL (ref 0–0.1)
Basophils Relative: 0 %
Eosinophils Absolute: 0 10*3/uL (ref 0–0.7)
Eosinophils Relative: 0 %
HCT: 40.7 % (ref 35.0–47.0)
HEMOGLOBIN: 13.2 g/dL (ref 12.0–16.0)
LYMPHS ABS: 0.4 10*3/uL — AB (ref 1.0–3.6)
Lymphocytes Relative: 2 %
MCH: 29.1 pg (ref 26.0–34.0)
MCHC: 32.5 g/dL (ref 32.0–36.0)
MCV: 89.6 fL (ref 80.0–100.0)
Monocytes Absolute: 0.4 10*3/uL (ref 0.2–0.9)
Monocytes Relative: 1 %
NEUTROS ABS: 23.8 10*3/uL — AB (ref 1.4–6.5)
NEUTROS PCT: 97 %
Platelets: 313 10*3/uL (ref 150–440)
RBC: 4.54 MIL/uL (ref 3.80–5.20)
RDW: 15.6 % — ABNORMAL HIGH (ref 11.5–14.5)
WBC: 24.6 10*3/uL — AB (ref 3.6–11.0)

## 2016-02-23 LAB — RAPID INFLUENZA A&B ANTIGENS (ARMC ONLY)
INFLUENZA A (ARMC): NEGATIVE
INFLUENZA B (ARMC): NEGATIVE

## 2016-02-23 LAB — TROPONIN I: TROPONIN I: 0.06 ng/mL — AB (ref <0.031)

## 2016-02-23 MED ORDER — AMIODARONE HCL IN DEXTROSE 360-4.14 MG/200ML-% IV SOLN
60.0000 mg/h | INTRAVENOUS | Status: DC
  Administered 2016-02-23: 60 mg/h via INTRAVENOUS
  Filled 2016-02-23: qty 200

## 2016-02-23 MED ORDER — LEVALBUTEROL HCL 1.25 MG/0.5ML IN NEBU
1.2500 mg | INHALATION_SOLUTION | Freq: Four times a day (QID) | RESPIRATORY_TRACT | Status: DC
  Administered 2016-02-23: 1.25 mg via RESPIRATORY_TRACT
  Filled 2016-02-23: qty 0.5

## 2016-02-23 MED ORDER — POTASSIUM CHLORIDE CRYS ER 20 MEQ PO TBCR
40.0000 meq | EXTENDED_RELEASE_TABLET | Freq: Once | ORAL | Status: AC
  Administered 2016-02-23: 40 meq via ORAL
  Filled 2016-02-23: qty 2

## 2016-02-23 MED ORDER — ASPIRIN 81 MG PO CHEW
324.0000 mg | CHEWABLE_TABLET | Freq: Once | ORAL | Status: AC
  Administered 2016-02-23: 324 mg via ORAL
  Filled 2016-02-23: qty 4

## 2016-02-23 MED ORDER — METHYLPREDNISOLONE SODIUM SUCC 125 MG IJ SOLR
60.0000 mg | Freq: Four times a day (QID) | INTRAMUSCULAR | Status: DC
  Administered 2016-02-23 – 2016-02-24 (×3): 60 mg via INTRAVENOUS
  Filled 2016-02-23 (×2): qty 2

## 2016-02-23 MED ORDER — ASPIRIN EC 325 MG PO TBEC
325.0000 mg | DELAYED_RELEASE_TABLET | Freq: Every day | ORAL | Status: DC
Start: 2016-02-23 — End: 2016-02-24

## 2016-02-23 MED ORDER — AMIODARONE LOAD VIA INFUSION: 150.0000 mg | Freq: Once | INTRAVENOUS | Status: DC

## 2016-02-23 MED ORDER — AMIODARONE HCL IN DEXTROSE 360-4.14 MG/200ML-% IV SOLN
30.0000 mg/h | INTRAVENOUS | Status: DC
  Administered 2016-02-24: 30 mg/h via INTRAVENOUS
  Filled 2016-02-23 (×4): qty 200

## 2016-02-23 MED ORDER — OXYCODONE HCL 5 MG PO TABS
5.0000 mg | ORAL_TABLET | Freq: Once | ORAL | Status: AC
  Administered 2016-02-23: 5 mg via ORAL

## 2016-02-23 MED ORDER — ONDANSETRON HCL 4 MG PO TABS: 4.0000 mg | ORAL_TABLET | Freq: Four times a day (QID) | ORAL | Status: DC | PRN

## 2016-02-23 MED ORDER — ACETAMINOPHEN 650 MG RE SUPP: 650.0000 mg | Freq: Four times a day (QID) | RECTAL | Status: DC | PRN

## 2016-02-23 MED ORDER — MOMETASONE FURO-FORMOTEROL FUM 200-5 MCG/ACT IN AERO
2.0000 | INHALATION_SPRAY | Freq: Two times a day (BID) | RESPIRATORY_TRACT | Status: DC
  Administered 2016-02-24 – 2016-02-28 (×8): 2 via RESPIRATORY_TRACT
  Filled 2016-02-23 (×2): qty 8.8

## 2016-02-23 MED ORDER — SODIUM CHLORIDE 0.9% FLUSH
3.0000 mL | Freq: Two times a day (BID) | INTRAVENOUS | Status: DC
  Administered 2016-02-23 – 2016-02-28 (×7): 3 mL via INTRAVENOUS

## 2016-02-23 MED ORDER — DEXTROSE 5 % IV SOLN
5.0000 mg/h | INTRAVENOUS | Status: DC
  Administered 2016-02-23: 5 mg/h via INTRAVENOUS
  Filled 2016-02-23: qty 100

## 2016-02-23 MED ORDER — PIPERACILLIN-TAZOBACTAM 3.375 G IVPB
3.3750 g | Freq: Once | INTRAVENOUS | Status: AC
  Administered 2016-02-23: 3.375 g via INTRAVENOUS
  Filled 2016-02-23: qty 50

## 2016-02-23 MED ORDER — DILTIAZEM HCL 25 MG/5ML IV SOLN
10.0000 mg | Freq: Once | INTRAVENOUS | Status: AC
  Administered 2016-02-23: 10 mg via INTRAVENOUS

## 2016-02-23 MED ORDER — SODIUM CHLORIDE 0.9 % IV BOLUS (SEPSIS)
500.0000 mL | Freq: Once | INTRAVENOUS | Status: AC
  Administered 2016-02-23: 500 mL via INTRAVENOUS

## 2016-02-23 MED ORDER — DILTIAZEM HCL 25 MG/5ML IV SOLN: 10.0000 mg | Freq: Once | INTRAVENOUS | Status: DC

## 2016-02-23 MED ORDER — AMIODARONE HCL IN DEXTROSE 360-4.14 MG/200ML-% IV SOLN: 60.0000 mg/h | INTRAVENOUS | Status: DC

## 2016-02-23 MED ORDER — KETOROLAC TROMETHAMINE 30 MG/ML IJ SOLN
INTRAMUSCULAR | Status: AC
  Administered 2016-02-23: 15 mg via INTRAVENOUS
  Filled 2016-02-23: qty 1

## 2016-02-23 MED ORDER — SODIUM CHLORIDE 0.9 % IV BOLUS (SEPSIS)
1000.0000 mL | Freq: Once | INTRAVENOUS | Status: AC
  Administered 2016-02-23: 1000 mL via INTRAVENOUS

## 2016-02-23 MED ORDER — SODIUM CHLORIDE 0.9 % IV BOLUS (SEPSIS)
680.0000 mL | Freq: Once | INTRAVENOUS | Status: AC
  Administered 2016-02-23: 680 mL via INTRAVENOUS

## 2016-02-23 MED ORDER — OXYCODONE HCL 5 MG PO TABS
ORAL_TABLET | ORAL | Status: AC
  Administered 2016-02-23: 5 mg via ORAL
  Filled 2016-02-23: qty 1

## 2016-02-23 MED ORDER — ADENOSINE 6 MG/2ML IV SOLN
6.0000 mg | Freq: Once | INTRAVENOUS | Status: AC
  Administered 2016-02-23: 6 mg via INTRAVENOUS

## 2016-02-23 MED ORDER — PANTOPRAZOLE SODIUM 40 MG PO TBEC
40.0000 mg | DELAYED_RELEASE_TABLET | Freq: Every day | ORAL | Status: DC
  Administered 2016-02-23 – 2016-02-28 (×6): 40 mg via ORAL
  Filled 2016-02-23 (×6): qty 1

## 2016-02-23 MED ORDER — AMIODARONE LOAD VIA INFUSION
150.0000 mg | Freq: Once | INTRAVENOUS | Status: AC
  Administered 2016-02-23: 150 mg via INTRAVENOUS
  Filled 2016-02-23: qty 83.34

## 2016-02-23 MED ORDER — AMIODARONE HCL IN DEXTROSE 360-4.14 MG/200ML-% IV SOLN: 30.0000 mg/h | INTRAVENOUS | Status: DC

## 2016-02-23 MED ORDER — DILTIAZEM HCL 25 MG/5ML IV SOLN
INTRAVENOUS | Status: AC
  Administered 2016-02-23: 10 mg via INTRAVENOUS
  Filled 2016-02-23: qty 5

## 2016-02-23 MED ORDER — KETOROLAC TROMETHAMINE 30 MG/ML IJ SOLN
15.0000 mg | Freq: Once | INTRAMUSCULAR | Status: AC
  Administered 2016-02-23: 15 mg via INTRAVENOUS

## 2016-02-23 MED ORDER — ENOXAPARIN SODIUM 40 MG/0.4ML ~~LOC~~ SOLN
40.0000 mg | SUBCUTANEOUS | Status: DC
  Administered 2016-02-23: 40 mg via SUBCUTANEOUS
  Filled 2016-02-23: qty 0.4

## 2016-02-23 MED ORDER — ONDANSETRON HCL 4 MG/2ML IJ SOLN
4.0000 mg | Freq: Four times a day (QID) | INTRAMUSCULAR | Status: DC | PRN
Start: 2016-02-23 — End: 2016-02-28

## 2016-02-23 MED ORDER — VANCOMYCIN HCL IN DEXTROSE 1-5 GM/200ML-% IV SOLN
1000.0000 mg | Freq: Once | INTRAVENOUS | Status: AC
  Administered 2016-02-23: 1000 mg via INTRAVENOUS
  Filled 2016-02-23: qty 200

## 2016-02-23 MED ORDER — DIGOXIN 0.25 MG/ML IJ SOLN: 0.2500 mg | Freq: Once | INTRAMUSCULAR | Status: DC

## 2016-02-23 MED ORDER — SODIUM CHLORIDE 0.9 % IV SOLN
INTRAVENOUS | Status: DC
  Administered 2016-02-23: 1000 mL via INTRAVENOUS
  Administered 2016-02-24 – 2016-02-27 (×4): via INTRAVENOUS

## 2016-02-23 MED ORDER — DILTIAZEM HCL 25 MG/5ML IV SOLN
15.0000 mg | Freq: Once | INTRAVENOUS | Status: AC
  Administered 2016-02-23: 15 mg via INTRAVENOUS

## 2016-02-23 MED ORDER — LEVOFLOXACIN IN D5W 500 MG/100ML IV SOLN
500.0000 mg | INTRAVENOUS | Status: DC
  Filled 2016-02-23: qty 100

## 2016-02-23 MED ORDER — ACETAMINOPHEN 325 MG PO TABS
650.0000 mg | ORAL_TABLET | Freq: Four times a day (QID) | ORAL | Status: DC | PRN
  Administered 2016-02-23 – 2016-02-24 (×2): 650 mg via ORAL
  Filled 2016-02-23 (×2): qty 2

## 2016-02-23 NOTE — ED Provider Notes (Signed)
[facility] Emergency Department Provider Note  ____________________________________________  Time seen: Approximately 5:05 PM  I have reviewed the triage vital signs and the nursing notes.   HISTORY  Chief Complaint Cough and Shortness of Breath    HPI Paula Krause is a 75 y.o. female history of COPD presents for evaluation of or days worsening cough, fevers, shortness of breath and right-sided chest pain, gradual onset, constant since onset, currently severe. She was started on Tamiflu 3 days ago in. We for flulike symptoms. She has had subjective fevers and chills at home. No vomiting, diarrhea, no abdominal pain.   Past Medical History  Diagnosis Date  . Asthma   . COPD (chronic obstructive pulmonary disease) ([place])   . Depression   . Anxiety   . Esophagitis   . GERD (gastroesophageal reflux disease)   . Insomnia   . Osteoporosis   . Tobacco abuse   . Cataract     right eye  . Shortness of breath dyspnea   . Wears dentures     full upper, partial lower    Patient Active Problem List   Diagnosis Date Noted  . Sepsis ([place]) 02/23/2016  . Special screening for malignant neoplasms, colon   . Benign neoplasm of sigmoid colon   . Black stools 12/25/2015  . Allergic rhinitis 08/24/2015  . Asthma   . COPD, severe ([place])   . Depression   . Anxiety   . Esophagitis   . GERD (gastroesophageal reflux disease)   . Insomnia   . Osteoporosis   . Tobacco abuse     Past Surgical History  Procedure Laterality Date  . Abdominal hysterectomy    . 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 Colonoscopy with propofol N/A 01/15/2016    Procedure: COLONOSCOPY WITH PROPOFOL;  Surgeon: Lucilla Lame, MD;  Location: [place];  Service: Endoscopy;  Laterality: N/A;  . Polypectomy  01/15/2016    Procedure: POLYPECTOMY;  Surgeon: Lucilla Lame, MD;  Location: [place];  Service: Endoscopy;;    Current Outpatient Rx  Name  Route  Sig  Dispense   Refill  . amitriptyline (ELAVIL) 50 MG tablet   Oral   Take 4 tablets (200 mg total) by mouth at bedtime.   120 tablet   6   . budesonide-formoterol (SYMBICORT) 160-4.5 MCG/ACT inhaler   Inhalation   Inhale 2 puffs into the lungs 2 (two) times daily as needed (for shortness of breath).         . Multiple Vitamins-Minerals (CENTRUM SILVER PO)   Oral   Take 1 tablet by mouth daily.          . Multiple Vitamins-Minerals (HAIR/SKIN/NAILS PO)   Oral   Take 1 tablet by mouth daily.          Marland Kitchen omeprazole (PRILOSEC) 20 MG capsule   Oral   Take 1 capsule (20 mg total) by mouth daily.   30 capsule   12   . montelukast (SINGULAIR) 10 MG tablet   Oral   Take 1 tablet (10 mg total) by mouth at bedtime. Patient not taking: Reported on 01/15/2016   30 tablet   3   . Na Sulfate-K Sulfate-Mg Sulf (SUPREP BOWEL PREP) SOLN   Oral   Take 1 kit by mouth as directed. Patient not taking: Reported on 02/23/2016   1 Bottle   0   . polyethylene glycol (GOLYTELY) 236 g solution   Oral   Take 4,000 mLs by  mouth once. Drink one 8oz glass every 30 mins until stools are clear. Patient not taking: Reported on 02/23/2016   4000 mL   0   . zaleplon (SONATA) 5 MG capsule   Oral   Take 1 capsule (5 mg total) by mouth at bedtime as needed for sleep. Patient not taking: Reported on 01/15/2016   30 capsule   0     Allergies Review of patient's allergies indicates no known allergies.  Family History  Problem Relation Age of Onset  . Diabetes Father   . Cancer Father     brain  . Hyperlipidemia Mother   . Dementia Mother     Social History Social History  Substance Use Topics  . Smoking status: Current Every Day Smoker -- 1.00 packs/day for 60 years    Types: Cigarettes  . Smokeless tobacco: Never Used  . Alcohol Use: No    Review of Systems Constitutional: + fever/chills Eyes: No visual changes. ENT: No sore throat. Cardiovascular: + chest pain. Respiratory: +shortness of  breath. Gastrointestinal: No abdominal pain.  No nausea, no vomiting.  No diarrhea.  No constipation. Genitourinary: Negative for dysuria. Musculoskeletal: Negative for back pain. Skin: Negative for rash. Neurological: Negative for headaches, focal weakness or numbness.  10-point ROS otherwise negative.  ____________________________________________   PHYSICAL EXAM:  VITAL SIGNS: ED Triage Vitals  Enc Vitals Group     BP 02/23/16 1640 83/39 mmHg     Pulse Rate 02/23/16 1640 94     Resp 02/23/16 1640 20     Temp 02/23/16 1640 98.9 F (37.2 C)     Temp Source 02/23/16 1640 Oral     SpO2 02/23/16 1640 91 %     Weight 02/23/16 1640 106 lb (48.081 kg)     Height 02/23/16 1640 5' (1.524 m)     Head Cir --      Peak Flow --      Pain Score 02/23/16 1640 0     Pain Loc --      Pain Edu? --      Excl. in [place]? --     Constitutional: Alert and oriented. Ill appearing and in mild respiratory distress. Eyes: Conjunctivae are normal. PERRL. EOMI. Head: Atraumatic. Nose: No congestion/rhinnorhea. Mouth/Throat: Mucous membranes are dry.  Oropharynx non-erythematous. Neck: No stridor.  Supple without meningismus. Cardiovascular: Tachycardic rate, irregular rhythm. Grossly normal heart sounds.  Good peripheral circulation. Respiratory: Tachypneic with mildly increased work of breathing. Diminished breath sounds in the right base. Gastrointestinal: Soft and nontender. No distention. No CVA tenderness. Genitourinary: deferred Musculoskeletal: No lower extremity tenderness nor edema.  No joint effusions. Neurologic:  Normal speech and language. No gross focal neurologic deficits are appreciated.  Skin:  Skin is warm, dry and intact. No rash noted. Psychiatric: Mood and affect are normal. Speech and behavior are normal.  ____________________________________________   LABS (all labs ordered are listed, but only abnormal results are displayed)  Labs Reviewed  CBC WITH  DIFFERENTIAL/PLATELET - Abnormal; Notable for the following:    WBC 24.6 (*)    RDW 15.6 (*)    Neutro Abs 23.8 (*)    Lymphs Abs 0.4 (*)    All other components within normal limits  COMPREHENSIVE METABOLIC PANEL - Abnormal; Notable for the following:    Sodium 134 (*)    Potassium 3.3 (*)    CO2 18 (*)    Glucose, Bld 151 (*)    BUN 54 (*)    Creatinine, Ser 1.41 (*)  Albumin 2.9 (*)    AST 50 (*)    Alkaline Phosphatase 168 (*)    GFR calc non Af Amer 36 (*)    GFR calc Af Amer 42 (*)    All other components within normal limits  TROPONIN I - Abnormal; Notable for the following:    Troponin I 0.06 (*)    All other components within normal limits  RAPID INFLUENZA A&B ANTIGENS ([facility] ONLY)  CULTURE, BLOOD (ROUTINE X 2)  CULTURE, BLOOD (ROUTINE X 2)  URINALYSIS COMPLETEWITH MICROSCOPIC ([facility] ONLY)  INFLUENZA PANEL BY PCR (TYPE A & B, H1N1)  CBC  CREATININE, SERUM  TSH  TROPONIN I  TROPONIN I  TROPONIN I  COMPREHENSIVE METABOLIC PANEL  MAGNESIUM   ____________________________________________  EKG  ED ECG REPORT I, Joanne Gavel, the attending physician, personally viewed and interpreted this ECG.   Date: 02/23/2016  EKG Time: 16:41  Rate: 176  Rhythm: atrial fibrillation, rate 176  Axis: right  Intervals:none  ST&T Change: No acute ST elevation.  ____________________________________________  RADIOLOGY  CXR IMPRESSION: Right basilar pneumonia. ____________________________________________   PROCEDURES  Procedure(s) performed: None  Critical Care performed: Yes, see critical care note(s). Total critical care time spent 40 minutes.  ____________________________________________   INITIAL IMPRESSION / ASSESSMENT AND PLAN / ED COURSE  Pertinent labs & imaging results that were available during my care of the patient were reviewed by me and considered in my medical decision making (see chart for details).  Paula Krause is a 75 y.o. female  history of COPD presents for evaluation of or days worsening cough, fevers, shortness of breath and right-sided chest pain. On arrival to the emergency department she was ill-appearing and in mild to moderate respiratory distress. She was tachycardic with initial heart rate of 170-180 bpm. On arrival, her EKG was concerning to me for atrial flutter versus supraventricular tachycardia. She received 6 mg of adenosine at which time her underlying rhythm was noted to be atrial flutter. Her rate returned to 170 bpm is improving with IV Cardizem. She is meeting SIRS criteria for tachypnea and tachycardia, give empiric antibiotics (Vancomycin and Zosyn), liberal IV fluids, obtain screening labs, chest x-ray and anticipate admission.  ----------------------------------------- 5:20 PM on 02/23/2016 ----------------------------------------- Heart rate improved from 170-137 at this time after a total of 25 mg of IV Cardizem. I have ordered a Cardizem drip.  ----------------------------------------- 6:25 PM on 02/23/2016 ----------------------------------------- Labs reviewed, CBC with leukocytosis, white blood cell count is 24.6. Negative flu. CMP with mild creatinine elevation 1.41. troponin elevated 0.06 likely secondary to demand ischemia. Chest x-ray shows right-sided pneumonia. HR improving to 123 at this time, blood pressure 104/57, case discussed with the hospitalist, Dr. Posey Pronto, for admission at this time.  ----------------------------------------- 7:00 PM on 02/23/2016 ----------------------------------------- Dr. Posey Pronto has evaluated the patient and discontinued cardizem drip due to new-onset hypotension with systolic blood pressure in the 80s. The patient is receiving IV fluids. The admitting physician is aware of her hypotension.  ____________________________________________   FINAL CLINICAL IMPRESSION(S) / ED DIAGNOSES  Final diagnoses:  Pneumonia involving right lung, unspecified part of lung   Sepsis, due to unspecified organism [facility])  Atrial fibrillation with rapid ventricular response (HCC)      Joanne Gavel, MD 02/23/16 [place]

## 2016-02-23 NOTE — H&P (Signed)
[place] [facility] [place] NAME: Paula Krause    MR#:  [id number]  DATE OF BIRTH:  09/26/1941  DATE OF ADMISSION:  02/23/2016  PRIMARY CARE PHYSICIAN: Kathrine Haddock, NP   REQUESTING/REFERRING PHYSICIAN: Loura Pardon MD  CHIEF COMPLAINT:   Chief Complaint  Patient presents with  . Cough  . Shortness of Breath    HISTORY OF PRESENT ILLNESS: Paula Krause  is a 75 y.o. female with a known history of  COPD, depression, anxiety, GERD, tobacco abuse who has not been feeling well for the past few days. Patient has had a dry cough. Has been having pain and there right side of her chest. She also has been wheezing. She has also had fevers recently and took over-the-counter Tamiflu. Patient came to the ER with these symptoms was noted to be in atrial fibrillation with rapid ventricular rate started on Cardizem drip and her blood pressure dropped into the 80s. Currently heart rate continues to be in the 120s blood pressure in the 80s. She was also noticed to have pneumonia. Any chest pain PAST MEDICAL HISTORY:   Past Medical History  Diagnosis Date  . Asthma   . COPD (chronic obstructive pulmonary disease) ([place])   . Depression   . Anxiety   . Esophagitis   . GERD (gastroesophageal reflux disease)   . Insomnia   . Osteoporosis   . Tobacco abuse   . Cataract     right eye  . Shortness of breath dyspnea   . Wears dentures     full upper, partial lower    PAST SURGICAL HISTORY: Past Surgical History  Procedure Laterality Date  . Abdominal hysterectomy    . 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 Colonoscopy with propofol N/A 01/15/2016    Procedure: COLONOSCOPY WITH PROPOFOL;  Surgeon: Lucilla Lame, MD;  Location: [place];  Service: Endoscopy;  Laterality: N/A;  . Polypectomy  01/15/2016    Procedure: POLYPECTOMY;  Surgeon: Lucilla Lame, MD;  Location: [place];  Service: Endoscopy;;    SOCIAL HISTORY:  Social History   Substance Use Topics  . Smoking status: Current Every Day Smoker -- 1.00 packs/day for 60 years    Types: Cigarettes  . Smokeless tobacco: Never Used  . Alcohol Use: No    FAMILY HISTORY:  Family History  Problem Relation Age of Onset  . Diabetes Father   . Cancer Father     brain  . Hyperlipidemia Mother   . Dementia Mother     DRUG ALLERGIES: No Known Allergies  REVIEW OF SYSTEMS:   CONSTITUTIONAL:Positive fever, positive fatigue and weakness.  EYES: No blurred or double vision.  EARS, NOSE, AND THROAT: No tinnitus or ear pain.  RESPIRATORY: None productive cough, positive shortness of breath, positive wheezing , no hemoptysis.  CARDIOVASCULAR: No chest pain, orthopnea, edema.  GASTROINTESTINAL: No nausea, vomiting, diarrhea or abdominal pain.  GENITOURINARY: No dysuria, hematuria.  ENDOCRINE: No polyuria, nocturia,  HEMATOLOGY: No anemia, easy bruising or bleeding SKIN: No rash or lesion. MUSCULOSKELETAL: Pain in multiple joints pain   NEUROLOGIC: No tingling, numbness, weakness.  PSYCHIATRY: No anxiety or depression.   MEDICATIONS AT HOME:  Prior to Admission medications   Medication Sig Start Date End Date Taking? Authorizing Provider  amitriptyline (ELAVIL) 50 MG tablet Take 4 tablets (200 mg total) by mouth at bedtime. 12/25/15  Yes Kathrine Haddock, NP  budesonide-formoterol (SYMBICORT) 160-4.5 MCG/ACT inhaler Inhale 2 puffs into the lungs  2 (two) times daily as needed (for shortness of breath).   Yes Historical Provider, MD  Multiple Vitamins-Minerals (CENTRUM SILVER PO) Take 1 tablet by mouth daily.    Yes Historical Provider, MD  Multiple Vitamins-Minerals (HAIR/SKIN/NAILS PO) Take 1 tablet by mouth daily.    Yes Historical Provider, MD  omeprazole (PRILOSEC) 20 MG capsule Take 1 capsule (20 mg total) by mouth daily. 07/13/15  Yes Kathrine Haddock, NP  montelukast (SINGULAIR) 10 MG tablet Take 1 tablet (10 mg total) by mouth at bedtime. Patient not taking: Reported on  01/15/2016 12/04/15   Kathrine Haddock, NP  Na Sulfate-K Sulfate-Mg Sulf (SUPREP BOWEL PREP) SOLN Take 1 kit by mouth as directed. Patient not taking: Reported on 02/23/2016 01/12/16   Lucilla Lame, MD  polyethylene glycol (GOLYTELY) 236 g solution Take 4,000 mLs by mouth once. Drink one 8oz glass every 30 mins until stools are clear. Patient not taking: Reported on 02/23/2016 12/30/15   Lucilla Lame, MD  zaleplon (SONATA) 5 MG capsule Take 1 capsule (5 mg total) by mouth at bedtime as needed for sleep. Patient not taking: Reported on 01/15/2016 12/04/15   Kathrine Haddock, NP      PHYSICAL EXAMINATION:   VITAL SIGNS: Blood pressure 80/55, pulse 111, temperature 98.9 F (37.2 C), temperature source Oral, resp. rate 25, height _0  (1.6 m), weight 56.246 kg (124 lb), SpO2 95 %.  GENERAL:  75 y.o.-year-old patient lying in the bed with no acute distress.  EYES: Pupils equal, round, reactive to light and accommodation. No scleral icterus. Extraocular muscles intact.  HEENT: Head atraumatic, normocephalic. Oropharynx and nasopharynx clear.  NECK:  Supple, no jugular venous distention. No thyroid enlargement, no tenderness.  LUNGS: Has bilateral wheezing throughout both lungs. Some associated muscle usage CARDIOVASCULAR: Irregularly irregular heart rhythm no murmurs rubs clicks or gallops  ABDOMEN: Soft, nontender, nondistended. Bowel sounds present. No organomegaly or mass.  EXTREMITIES: No pedal edema, cyanosis, or clubbing.  NEUROLOGIC: Cranial nerves II through XII are intact. Muscle strength 5/5 in all extremities. Sensation intact. Gait not checked.  PSYCHIATRIC: The patient is alert and oriented x 3.  SKIN: No obvious rash, lesion, or ulcer.   LABORATORY PANEL:   CBC  Recent Labs Lab 02/23/16 1652  WBC 24.6*  HGB 13.2  HCT 40.7  PLT 313  MCV 89.6  MCH 29.1  MCHC 32.5  RDW 15.6*  LYMPHSABS 0.4*  MONOABS 0.4  EOSABS 0.0  BASOSABS 0.0    ------------------------------------------------------------------------------------------------------------------  Chemistries   Recent Labs Lab 02/23/16 1652  NA 134*  K 3.3*  CL 101  CO2 18*  GLUCOSE 151*  BUN 54*  CREATININE 1.41*  CALCIUM 9.3  AST 50*  ALT 33  ALKPHOS 168*  BILITOT 1.2   ------------------------------------------------------------------------------------------------------------------ estimated creatinine clearance is 29.4 mL/min (by C-G formula based on Cr of 1.41). ------------------------------------------------------------------------------------------------------------------ No results for input(s): TSH, T4TOTAL, T3FREE, THYROIDAB in the last 72 hours.  Invalid input(s): FREET3   Coagulation profile No results for input(s): INR, PROTIME in the last 168 hours. ------------------------------------------------------------------------------------------------------------------- No results for input(s): DDIMER in the last 72 hours. -------------------------------------------------------------------------------------------------------------------  Cardiac Enzymes  Recent Labs Lab 02/23/16 1652  TROPONINI 0.06*   ------------------------------------------------------------------------------------------------------------------ Invalid input(s): POCBNP  ---------------------------------------------------------------------------------------------------------------  Urinalysis No results found for: COLORURINE, APPEARANCEUR, LABSPEC, PHURINE, GLUCOSEU, HGBUR, BILIRUBINUR, KETONESUR, PROTEINUR, UROBILINOGEN, NITRITE, LEUKOCYTESUR   RADIOLOGY: Dg Chest Portable 1 View  02/23/2016  CLINICAL DATA:  Cough and shortness of breath.  Fever and chills. EXAM: PORTABLE CHEST 1 VIEW COMPARISON:  09/02/2013 FINDINGS: There is a hazy right basilar opacity at least affecting the right middle lobe with obscuration of the obscuring the right heart border. No air  bronchograms the given appearance and history still most likely pneumonia. No layering pleural fluid. No pulmonary edema. Normal heart size and aortic contours. IMPRESSION: Right basilar pneumonia. Electronically Signed   By: Monte Fantasia M.D.   On: 02/23/2016 17:48    EKG: Orders placed or performed in visit on 02/10/08  . EKG 12-Lead    IMPRESSION AND PLAN: Patient is a 75 year old white female presenting with cough fevers  1. Community-acquired pneumonia: We'll follow her blood cultures patient will be on IV Levaquin  2. Acute on chronic COPD exasperation will treat with nebulizers with Xopenex due to elevated heart rate and Solu-Medrol  3. Atrial fibrillation with rapid ventricular rate patient's blood pressures low-dose I will change her from Cardizem to amiodarone, check a echocardiogram of the heart, give 1 dose of digoxin. I have discussed case with Dr. Candis Musa. Will obtain echocardiogram of the heart  4. Acute renal failure due to dehydration we'll give her IV fluids renal function  5. GERD we'll continue PPIs  6. Tobacco abuse smoking cessation provided 3 minutes spent patient recommended stop smoking will hold on nicotine replacement in light of very elevated heart rate  7. Miscellaneous we'll do Lovenox for DVT prophylaxis     All the records are reviewed and case discussed with ED provider. Management plans discussed with the patient, family and they are in agreement.  CODE STATUS:Full     Code Status Orders        Start     Ordered   02/23/16 1851  Full code   Continuous     02/23/16 1851    Code Status History    Date Active Date Inactive Code Status Order ID Comments User Context   This patient has a current code status but no historical code status.       TOTAL TIME TAKING CARE OF THIS PATIENT: 55 min critical care time  Dustin Flock M.D on 02/23/2016 at 6:59 PM  Between 7am to 6pm - Pager - [phone]  After 6pm go to [email or web address] -  password [email or web address] [facility]  Office  [phone]  CC: Primary care physician; Kathrine Haddock, NP

## 2016-02-23 NOTE — ED Notes (Signed)
Patient medicated for pain with toradol.and PO opiate. Explained to patient and family that due to the concern of her low BP it is unwise to give too much medication at this time. Patient is fixated on getting more medication. Dr. Edd Fabian in to speak with patient. Will continue to monitor.

## 2016-02-23 NOTE — ED Notes (Signed)
Family updated on status of admission. Patient needs ICU bed. Family understands it may be awhile.

## 2016-02-23 NOTE — ED Notes (Signed)
Pt here with c/o cough, shob, pain with cough and weakness over the past few days. Was placed on tamiflu Sunday night, still having chills and fevers, pt is anxious.

## 2016-02-24 ENCOUNTER — Inpatient Hospital Stay (HOSPITAL_COMMUNITY)
Admit: 2016-02-24 | Discharge: 2016-02-24 | Disposition: A | Discharge status: Home / Self Care | Payer: Commercial Managed Care - HMO | Attending: Cardiovascular Disease | Admitting: Cardiovascular Disease

## 2016-02-24 DIAGNOSIS — J432 Centrilobular emphysema: Secondary | ICD-10-CM | POA: Insufficient documentation

## 2016-02-24 DIAGNOSIS — I4891 Unspecified atrial fibrillation: Secondary | ICD-10-CM

## 2016-02-24 DIAGNOSIS — A419 Sepsis, unspecified organism: Secondary | ICD-10-CM

## 2016-02-24 DIAGNOSIS — I959 Hypotension, unspecified: Secondary | ICD-10-CM

## 2016-02-24 DIAGNOSIS — J189 Pneumonia, unspecified organism: Secondary | ICD-10-CM | POA: Insufficient documentation

## 2016-02-24 DIAGNOSIS — F17200 Nicotine dependence, unspecified, uncomplicated: Secondary | ICD-10-CM | POA: Insufficient documentation

## 2016-02-24 DIAGNOSIS — Z720 Tobacco use: Secondary | ICD-10-CM

## 2016-02-24 LAB — BLOOD CULTURE ID PANEL (REFLEXED)
Acinetobacter baumannii: NOT DETECTED
CANDIDA KRUSEI: NOT DETECTED
CANDIDA PARAPSILOSIS: NOT DETECTED
CARBAPENEM RESISTANCE: NOT DETECTED
Candida albicans: NOT DETECTED
Candida glabrata: NOT DETECTED
Candida tropicalis: NOT DETECTED
ENTEROBACTERIACEAE SPECIES: NOT DETECTED
ENTEROCOCCUS SPECIES: NOT DETECTED
Enterobacter cloacae complex: NOT DETECTED
Escherichia coli: NOT DETECTED
Haemophilus influenzae: NOT DETECTED
KLEBSIELLA OXYTOCA: NOT DETECTED
KLEBSIELLA PNEUMONIAE: NOT DETECTED
Listeria monocytogenes: NOT DETECTED
METHICILLIN RESISTANCE: NOT DETECTED
Neisseria meningitidis: NOT DETECTED
PROTEUS SPECIES: NOT DETECTED
PSEUDOMONAS AERUGINOSA: NOT DETECTED
SERRATIA MARCESCENS: NOT DETECTED
STREPTOCOCCUS AGALACTIAE: NOT DETECTED
STREPTOCOCCUS PNEUMONIAE: DETECTED — AB
STREPTOCOCCUS SPECIES: DETECTED — AB
Staphylococcus aureus (BCID): NOT DETECTED
Staphylococcus species: NOT DETECTED
Streptococcus pyogenes: NOT DETECTED
VANCOMYCIN RESISTANCE: NOT DETECTED

## 2016-02-24 LAB — LIPID PANEL
Cholesterol: 97 mg/dL (ref 0–200)
LDL CALC: UNDETERMINED mg/dL (ref 0–99)
Total CHOL/HDL Ratio: UNDETERMINED RATIO
Triglycerides: 172 mg/dL — ABNORMAL HIGH (ref <150)
VLDL: 34 mg/dL (ref 0–40)

## 2016-02-24 LAB — URINALYSIS COMPLETE WITH MICROSCOPIC (ARMC ONLY)
BACTERIA UA: NONE SEEN
BILIRUBIN URINE: NEGATIVE
Glucose, UA: NEGATIVE mg/dL
HGB URINE DIPSTICK: NEGATIVE
Ketones, ur: NEGATIVE mg/dL
LEUKOCYTES UA: NEGATIVE
Nitrite: NEGATIVE
PH: 5 (ref 5.0–8.0)
PROTEIN: 100 mg/dL — AB
Specific Gravity, Urine: 1.028 (ref 1.005–1.030)

## 2016-02-24 LAB — COMPREHENSIVE METABOLIC PANEL
ALT: 26 U/L (ref 14–54)
AST: 33 U/L (ref 15–41)
Albumin: 2 g/dL — ABNORMAL LOW (ref 3.5–5.0)
Alkaline Phosphatase: 110 U/L (ref 38–126)
Anion gap: 8 (ref 5–15)
BUN: 44 mg/dL — AB (ref 6–20)
CHLORIDE: 112 mmol/L — AB (ref 101–111)
CO2: 18 mmol/L — AB (ref 22–32)
CREATININE: 0.95 mg/dL (ref 0.44–1.00)
Calcium: 7.9 mg/dL — ABNORMAL LOW (ref 8.9–10.3)
GFR calc Af Amer: >60 mL/min (ref >60)
GFR calc non Af Amer: 58 mL/min — ABNORMAL LOW (ref >60)
GLUCOSE: 187 mg/dL — AB (ref 65–99)
Potassium: 3.6 mmol/L (ref 3.5–5.1)
SODIUM: 138 mmol/L (ref 135–145)
Total Bilirubin: 1.1 mg/dL (ref 0.3–1.2)
Total Protein: 5.2 g/dL — ABNORMAL LOW (ref 6.5–8.1)

## 2016-02-24 LAB — MRSA PCR SCREENING: MRSA BY PCR: POSITIVE — AB

## 2016-02-24 LAB — TSH: TSH: 1.94 u[IU]/mL (ref 0.350–4.500)

## 2016-02-24 LAB — ECHOCARDIOGRAM COMPLETE
Height: 63 in
Weight: 1984 oz

## 2016-02-24 LAB — INFLUENZA PANEL BY PCR (TYPE A & B)
H1N1 flu by pcr: NOT DETECTED
Influenza A By PCR: NEGATIVE
Influenza B By PCR: NEGATIVE

## 2016-02-24 LAB — TROPONIN I: Troponin I: <0.03 ng/mL (ref <0.031)

## 2016-02-24 LAB — GLUCOSE, CAPILLARY: Glucose-Capillary: 170 mg/dL — ABNORMAL HIGH (ref 65–99)

## 2016-02-24 LAB — HEMOGLOBIN A1C: HEMOGLOBIN A1C: 5.9 % (ref 4.0–6.0)

## 2016-02-24 LAB — MAGNESIUM: Magnesium: 2.1 mg/dL (ref 1.7–2.4)

## 2016-02-24 MED ORDER — DEXTROSE 5 % IV SOLN
2.0000 g | INTRAVENOUS | Status: DC
  Administered 2016-02-24 – 2016-02-26 (×3): 2 g via INTRAVENOUS
  Filled 2016-02-24 (×4): qty 2

## 2016-02-24 MED ORDER — LEVALBUTEROL HCL 1.25 MG/0.5ML IN NEBU
1.2500 mg | INHALATION_SOLUTION | Freq: Four times a day (QID) | RESPIRATORY_TRACT | Status: DC
  Administered 2016-02-24 – 2016-02-27 (×13): 1.25 mg via RESPIRATORY_TRACT
  Filled 2016-02-24 (×15): qty 0.5

## 2016-02-24 MED ORDER — PREDNISONE 20 MG PO TABS: 20.0000 mg | ORAL_TABLET | Freq: Every day | ORAL | Status: DC

## 2016-02-24 MED ORDER — RIVAROXABAN 15 MG PO TABS
15.0000 mg | ORAL_TABLET | Freq: Every day | ORAL | Status: DC
  Filled 2016-02-24: qty 1

## 2016-02-24 MED ORDER — LEVOFLOXACIN IN D5W 250 MG/50ML IV SOLN
250.0000 mg | INTRAVENOUS | Status: DC
  Filled 2016-02-24: qty 50

## 2016-02-24 MED ORDER — AMIODARONE HCL 200 MG PO TABS
400.0000 mg | ORAL_TABLET | Freq: Two times a day (BID) | ORAL | Status: DC
  Administered 2016-02-24 – 2016-02-28 (×9): 400 mg via ORAL
  Filled 2016-02-24 (×9): qty 2

## 2016-02-24 MED ORDER — PREDNISONE 10 MG PO TABS: 10.0000 mg | ORAL_TABLET | Freq: Every day | ORAL | Status: DC

## 2016-02-24 MED ORDER — PREDNISONE 20 MG PO TABS
40.0000 mg | ORAL_TABLET | Freq: Every day | ORAL | Status: AC
  Administered 2016-02-25 – 2016-02-26 (×2): 40 mg via ORAL
  Filled 2016-02-24 (×2): qty 2

## 2016-02-24 MED ORDER — NICOTINE 21 MG/24HR TD PT24
21.0000 mg | MEDICATED_PATCH | Freq: Every day | TRANSDERMAL | Status: DC
  Administered 2016-02-24 – 2016-02-28 (×5): 21 mg via TRANSDERMAL
  Filled 2016-02-24 (×5): qty 1

## 2016-02-24 MED ORDER — CHLORHEXIDINE GLUCONATE CLOTH 2 % EX PADS
6.0000 | MEDICATED_PAD | Freq: Every day | CUTANEOUS | Status: AC
  Administered 2016-02-24 – 2016-02-28 (×5): 6 via TOPICAL

## 2016-02-24 MED ORDER — MUPIROCIN 2 % EX OINT
1.0000 "application " | TOPICAL_OINTMENT | Freq: Two times a day (BID) | CUTANEOUS | Status: DC
  Administered 2016-02-24 – 2016-02-28 (×9): 1 via NASAL
  Filled 2016-02-24: qty 22

## 2016-02-24 MED ORDER — PREDNISONE 20 MG PO TABS
30.0000 mg | ORAL_TABLET | Freq: Every day | ORAL | Status: AC
  Administered 2016-02-27 – 2016-02-28 (×2): 30 mg via ORAL
  Filled 2016-02-24 (×2): qty 1

## 2016-02-24 MED ORDER — ENOXAPARIN SODIUM 30 MG/0.3ML ~~LOC~~ SOLN: 30.0000 mg | Freq: Every day | SUBCUTANEOUS | Status: DC

## 2016-02-24 NOTE — Progress Notes (Signed)
Pharmacy Antibiotic Follow-up Note  Paula Krause is a 75 y.o. year-old female admitted on 02/23/2016.  The patient is currently on day 1 of Levaquin for sepsis.  Assessment/Plan: BCID results were discussed with Dr. Lavetta Nielsen and the patient's antibiotics will be changed to Ceftriaxone 2 gm IV Q24H. Further narrowing will be determined based on finalized susceptibilities.  Temp (24hrs), Avg:98.3 F (36.8 C), Min:97.7 F (36.5 C), Max:98.9 F (37.2 C)   Recent Labs Lab 02/23/16 1652  WBC 24.6*    Recent Labs Lab 02/23/16 1652 02/24/16 0414  CREATININE 1.41* 0.95   Estimated Creatinine Clearance: 43.6 mL/min (by C-G formula based on Cr of 0.95).    No Known Allergies  Antimicrobials this admission: Levaquin 250 mg IV Q24H   >> Ceftriaxone 2 gm IV Q24H.    >>   Levels/dose changes this admission:   Microbiology results: 3/29  BCx: Strep Pneumo growing in Anaerobic and aerobic bottles   UCx:    Sputum:    MRSA PCR:   Thank you for allowing pharmacy to be a part of this patient's care.  Haneef Hallquist D PharmD 02/24/2016 4:12 PM

## 2016-02-24 NOTE — Progress Notes (Signed)
Pt with orders for levofloxacin for CAP. Ordered levofloxacin 500mg  IV q24hrs.   Estimated Creatinine Clearance: 43.6 mL/min (by C-G formula based on Cr of 0.95).  Due to renal function, will decrease dose to Levofloxacin 500mg  IV x1 followed by 250mg  IV daily.   Pharmacy will continue to monitor for changes in renal function.  Roe Coombs, PharmD Pharmacy Resident 02/24/2016

## 2016-02-24 NOTE — Consult Note (Signed)
Cardiology Consultation Note  Patient ID: Paula Krause, MRN: [id number], DOB/AGE: Apr 16, 1941 75 y.o. Admit date: 02/23/2016   Date of Consult: 02/24/2016 Primary Physician: Kathrine Haddock, NP Primary Cardiologist: New to [facility]  Chief Complaint: SOB Reason for Consult: Afib with RVR  HPI: 75 y.o. female with h/o COPD, ongoing tobacco abuse, prior right ophthalmic artery aneurysm s/p embolization in 2013, asthma, and poor nutrition who presented to [facility] after a several week history of increased SOB and was found to have a right basilar PNA leading to septic shock and new onset Afib with RVR with heart rates in the 170's.   She has no previously known cardiac history and has never seen a cardiologist. No prior echocardiograms, stress tests, or cardiac catheterizations. She has been experiencing increased dyspnea over the past several weeks coupled with cough that has been productive of green sputum. Appetite has been decreased with minimal PO intake. She was taking OTC influenza medication as she felt like she had the flu with subjective fever and chills. She was not able to feel any tachy-palpitations. Weight has been declining. No lower extremity edema, orthopnea, PND, or early satiety.   Upon the patient's arrival to [facility] they were found to have a WBC 24,000, troponin 0.06-->0.03, rapid influenza negative, influenza by PCR pending, albumin 2.9-->2.0, TSH normal, Mg++ 2.1. ECG showed Afib with RVR, 176 bpm, nonspecific st/t changes, CXR showed right basilar PNA.   Past Medical History  Diagnosis Date  . Asthma   . COPD (chronic obstructive pulmonary disease) ([place])   . Depression   . Anxiety   . Esophagitis   . GERD (gastroesophageal reflux disease)   . Insomnia   . Osteoporosis   . Tobacco abuse   . Cataract     right eye  . Shortness of breath dyspnea   . Wears dentures     full upper, partial lower      Most Recent Cardiac Studies: None   Surgical History:  Past Surgical  History  Procedure Laterality Date  . Abdominal hysterectomy    . 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 Colonoscopy with propofol N/A 01/15/2016    Procedure: COLONOSCOPY WITH PROPOFOL;  Surgeon: Lucilla Lame, MD;  Location: [place];  Service: Endoscopy;  Laterality: N/A;  . Polypectomy  01/15/2016    Procedure: POLYPECTOMY;  Surgeon: Lucilla Lame, MD;  Location: [place];  Service: Endoscopy;;     Home Meds: Prior to Admission medications   Medication Sig Start Date End Date Taking? Authorizing Provider  amitriptyline (ELAVIL) 50 MG tablet Take 4 tablets (200 mg total) by mouth at bedtime. 12/25/15  Yes Kathrine Haddock, NP  budesonide-formoterol (SYMBICORT) 160-4.5 MCG/ACT inhaler Inhale 2 puffs into the lungs 2 (two) times daily as needed (for shortness of breath).   Yes Historical Provider, MD  Multiple Vitamins-Minerals (CENTRUM SILVER PO) Take 1 tablet by mouth daily.    Yes Historical Provider, MD  Multiple Vitamins-Minerals (HAIR/SKIN/NAILS PO) Take 1 tablet by mouth daily.    Yes Historical Provider, MD  omeprazole (PRILOSEC) 20 MG capsule Take 1 capsule (20 mg total) by mouth daily. 07/13/15  Yes Kathrine Haddock, NP  montelukast (SINGULAIR) 10 MG tablet Take 1 tablet (10 mg total) by mouth at bedtime. Patient not taking: Reported on 01/15/2016 12/04/15   Kathrine Haddock, NP  Na Sulfate-K Sulfate-Mg Sulf (SUPREP BOWEL PREP) SOLN Take 1 kit by mouth as directed. Patient not taking: Reported on 02/23/2016 01/12/16   Lucilla Lame, MD  polyethylene glycol (GOLYTELY) 236 g solution Take 4,000 mLs by mouth once. Drink one 8oz glass every 30 mins until stools are clear. Patient not taking: Reported on 02/23/2016 12/30/15   Lucilla Lame, MD  zaleplon (SONATA) 5 MG capsule Take 1 capsule (5 mg total) by mouth at bedtime as needed for sleep. Patient not taking: Reported on 01/15/2016 12/04/15   Kathrine Haddock, NP    Inpatient Medications:  . aspirin EC  325 mg Oral Daily  . Chlorhexidine  Gluconate Cloth  6 each Topical Q0600  . enoxaparin (LOVENOX) injection  30 mg Subcutaneous QHS  . levalbuterol  1.25 mg Nebulization Q6H  . levofloxacin (LEVAQUIN) IV  500 mg Intravenous Q24H  . methylPREDNISolone (SOLU-MEDROL) injection  60 mg Intravenous Q6H  . mometasone-formoterol  2 puff Inhalation BID  . mupirocin ointment  1 application Nasal BID  . pantoprazole  40 mg Oral Daily  . sodium chloride flush  3 mL Intravenous Q12H   . sodium chloride 1,000 mL (02/23/16 1905)  . amiodarone 30 mg/hr (02/24/16 0202)    Allergies: No Known Allergies  Social History   Social History  . Marital Status: Married    Spouse Name: N/A  . Number of Children: N/A  . Years of Education: N/A   Occupational History  . Not on file.   Social History Main Topics  . Smoking status: Current Every Day Smoker -- 1.00 packs/day for 60 years    Types: Cigarettes  . Smokeless tobacco: Never Used  . Alcohol Use: No  . Drug Use: No  . Sexual Activity: No   Other Topics Concern  . Not on file   Social History Narrative     Family History  Problem Relation Age of Onset  . Diabetes Father   . Cancer Father     brain  . Hyperlipidemia Mother   . Dementia Mother     Review of Systems: Review of Systems  Constitutional: Positive for weight loss and malaise/fatigue. Negative for fever, chills and diaphoresis.  HENT: Negative for congestion.   Eyes: Negative for discharge and redness.  Respiratory: Positive for cough, sputum production, shortness of breath and wheezing. Negative for hemoptysis.   Cardiovascular: Negative for chest pain, palpitations, orthopnea, claudication, leg swelling and PND.  Gastrointestinal: Positive for abdominal pain and melena. Negative for nausea, vomiting, diarrhea, constipation and blood in stool.  Musculoskeletal: Negative for myalgias and falls.  Skin: Negative for rash.  Neurological: Positive for weakness. Negative for dizziness, tingling, tremors,  sensory change, speech change, focal weakness and loss of consciousness.  Endo/Heme/Allergies: Does not bruise/bleed easily.  Psychiatric/Behavioral: Positive for substance abuse. The patient is not nervous/anxious.        Ongoing tobacco abuse    Labs:  Recent Labs  02/23/16 1652 02/24/16 0049  TROPONINI 0.06* <0.03   Lab Results  Component Value Date   WBC 24.6* 02/23/2016   HGB 13.2 02/23/2016   HCT 40.7 02/23/2016   MCV 89.6 02/23/2016   PLT 313 02/23/2016    Recent Labs Lab 02/24/16 0414  NA 138  K 3.6  CL 112*  CO2 18*  BUN 44*  CREATININE 0.95  CALCIUM 7.9*  PROT 5.2*  BILITOT 1.1  ALKPHOS 110  ALT 26  AST 33  GLUCOSE 187*   No results found for: CHOL, HDL, LDLCALC, TRIG No results found for: DDIMER  Radiology/Studies:  Dg Chest Portable 1 View  02/23/2016  CLINICAL DATA:  Cough and shortness of breath.  Fever and  chills. EXAM: PORTABLE CHEST 1 VIEW COMPARISON:  09/02/2013 FINDINGS: There is a hazy right basilar opacity at least affecting the right middle lobe with obscuration of the obscuring the right heart border. No air bronchograms the given appearance and history still most likely pneumonia. No layering pleural fluid. No pulmonary edema. Normal heart size and aortic contours. IMPRESSION: Right basilar pneumonia. Electronically Signed   By: Monte Fantasia M.D.   On: 02/23/2016 17:48    EKG: Afib with RVR, 176 bpm, nonspecific st/t changes  Weights: Filed Weights   02/23/16 1640 02/23/16 1642  Weight: 106 lb (48.081 kg) 124 lb (56.246 kg)     Physical Exam: Blood pressure 94/66, pulse 81, temperature 98.4 F (36.9 C), temperature source Oral, resp. rate 20, height _0  (1.6 m), weight 124 lb (56.246 kg), SpO2 94 %. Body mass index is 21.97 kg/(m^2). General: Frail appearing, in no acute distress. Head: Normocephalic, atraumatic, sclera non-icteric, no xanthomas, nares are without discharge.  Neck: Negative for carotid bruits. JVD not  elevated. Lungs: Coarse breath sounds right-sided. Breathing is unlabored. Heart: RRR with S1 S2. No murmurs, rubs, or gallops appreciated. Abdomen: Soft, non-tender, non-distended with normoactive bowel sounds. No hepatomegaly. No rebound/guarding. No obvious abdominal masses. Msk:  Strength and tone appear normal for age. Extremities: No clubbing or cyanosis. No edema.  Distal pedal pulses are 2+ and equal bilaterally. Neuro: Alert and oriented X 3. No facial asymmetry. No focal deficit. Moves all extremities spontaneously. Psych:  Responds to questions appropriately with a normal affect.    Assessment and Plan:   1. New onset Afib with RVR: -Converted to sinus rhythm with heart rate in the 70's just after 19:00 on 3/28 and has remained in sinus since -Presented on Afib with RVR with heart rate in the 170's of unknown duration. Initially placed on Cardizem gtt, though this exacerbated her hypotension in the setting of her septic shock with PNA thus required discontinuation. Converted to sinus rhythm on amiodarone gtt. Not on full dose anticoagulation.  -CHADS2VASc at least 2 at this time (age x 1 and female). Echo and A1C are pending.  -She will require long term, full-dose anticoagulation given her CHADS2VASc as above based on EBM.  -Start Xarelto 20 mg q dinner.  -Transition from amiodarone gtt to PO amiodarone 400 mg bid  -She is at high risk of recurrent arrhythmia in the setting of her infection below -Her current hypotension precludes usage of rate limiting agents such as CCB or BB, add as BP allows moving forward -No indication for aspirin and full-dose anticoagulation at this time, aspirin 325 mg and VTE Lovenox discontinued.   2. Septic shock in the setting of right-sided PNA: -BP improved this morning with systolic >707 and MAP >86 -On Levaquin per IM -Steroids and inhalers per IM -Would avoid albuterol and use Xopenex given the above  3. Hypotension: -As above -Gentle  fluid resuscitation   4. COPD exacerbation: -ABX, steroids, and inhalers as above -Smoking cessation advised  5. Malnutrition: -Albumin of 2.0 -Has not eaten well for several weeks -Consider nutrition consult  -Will need PT  6. Acute renal injury: -Likely ATN in the setting of her hypotension and infection -Avoid nephrotoxins -Gentle hydration   7. History of melena: -Recent colonoscopy 4 weeks ago with single polyp removed, no melena since   Gladstone Lighter Pager: [phone] 02/24/2016, 7:45 AM

## 2016-02-24 NOTE — Progress Notes (Signed)
Pt refused xarelto due to history of aneurysm. Pt educated on risk of not taking med. Dr. Rockey Situ made aware. No additional meds ordered at this time. Vss, pt denies pain, family at the bedside.

## 2016-02-24 NOTE — Progress Notes (Signed)
[place] [place] NAME: Paula Krause    MR#:  [id number]  DATE OF BIRTH:  [birth year]  SUBJECTIVE:  Converted to NSR overnight,  Complains of cough Family at bedside  REVIEW OF SYSTEMS:  CONSTITUTIONAL: No fever, positive fatigue or weakness.  EYES: No blurred or double vision.  EARS, NOSE, AND THROAT: No tinnitus or ear pain.  RESPIRATORY: Positive cough, shortness of breath, wheezing denies hemoptysis.  CARDIOVASCULAR: No chest pain, orthopnea, edema.  GASTROINTESTINAL: No nausea, vomiting, diarrhea or abdominal pain.  GENITOURINARY: No dysuria, hematuria.  ENDOCRINE: No polyuria, nocturia,  HEMATOLOGY: No anemia, easy bruising or bleeding SKIN: No rash or lesion. MUSCULOSKELETAL: No joint pain or arthritis.   NEUROLOGIC: No tingling, numbness, weakness.  PSYCHIATRY: No anxiety or depression.   DRUG ALLERGIES:  No Known Allergies  VITALS:  Blood pressure 94/59, pulse 88, temperature 98.4 F (36.9 C), temperature source Oral, resp. rate 18, height 5\' 3"  (1.6 m), weight 56.246 kg (124 lb), SpO2 99 %.  PHYSICAL EXAMINATION:  VITAL SIGNS: Filed Vitals:   02/24/16 1000 02/24/16 1100  BP: 92/60 94/59  Pulse: 87 88  Temp:    Resp: 31 18   GENERAL:75 y.o.female currently in no acute distress.  HEAD: Normocephalic, atraumatic.  EYES: Pupils equal, round, reactive to light. Extraocular muscles intact. No scleral icterus.  MOUTH: Moist mucosal membrane. Dentition intact. No abscess noted.  EAR, NOSE, THROAT: Clear without exudates. No external lesions.  NECK: Supple. No thyromegaly. No nodules. No JVD.  PULMONARY:Diminished breath sounds throughout Right base coarse rhonchi with  without wheeze rails  No use of accessory muscles, Good respiratory effort. good air entry bilaterally CHEST: Nontender to palpation.  CARDIOVASCULAR: S1 and S2. Irregular rate and irregular rhythm. No murmurs, rubs, or gallops. No edema. Pedal  pulses 2+ bilaterally.  GASTROINTESTINAL: Soft, nontender, nondistended. No masses. Positive bowel sounds. No hepatosplenomegaly.  MUSCULOSKELETAL: No swelling, clubbing, or edema. Range of motion full in all extremities.  NEUROLOGIC: Cranial nerves II through XII are intact. No gross focal neurological deficits. Sensation intact. Reflexes intact.  SKIN: No ulceration, lesions, rashes, or cyanosis. Skin warm and dry. Turgor intact.  PSYCHIATRIC: Mood, affect within normal limits. The patient is awake, alert and oriented x 3. Insight, judgment intact.      LABORATORY PANEL:   CBC  Recent Labs Lab 02/23/16 1652  WBC 24.6*  HGB 13.2  HCT 40.7  PLT 313   ------------------------------------------------------------------------------------------------------------------  Chemistries   Recent Labs Lab 02/24/16 0414  NA 138  K 3.6  CL 112*  CO2 18*  GLUCOSE 187*  BUN 44*  CREATININE 0.95  CALCIUM 7.9*  MG 2.1  AST 33  ALT 26  ALKPHOS 110  BILITOT 1.1   ------------------------------------------------------------------------------------------------------------------  Cardiac Enzymes  Recent Labs Lab 02/24/16 0755  TROPONINI <0.03   ------------------------------------------------------------------------------------------------------------------  RADIOLOGY:  Dg Chest Portable 1 View  02/23/2016  CLINICAL DATA:  Cough and shortness of breath.  Fever and chills. EXAM: PORTABLE CHEST 1 VIEW COMPARISON:  09/02/2013 FINDINGS: There is a hazy right basilar opacity at least affecting the right middle lobe with obscuration of the obscuring the right heart border. No air bronchograms the given appearance and history still most likely pneumonia. No layering pleural fluid. No pulmonary edema. Normal heart size and aortic contours. IMPRESSION: Right basilar pneumonia. Electronically Signed   By: Monte Fantasia M.D.   On: 02/23/2016 17:48    EKG:   Orders placed or performed  during the hospital encounter of 02/23/16  . EKG 12-Lead  . EKG 12-Lead    ASSESSMENT AND PLAN:   [name] history of COPD and tobacco abuse presenting with shortness of breath admitted 02/15/2016  1. Sepsis: Meeting septic criteria on arrival by heart rate respiratory rate was ptosis: Secondary to community-acquired pneumonia, continue antibiotic coverage currently with Levaquin supplemental oxygen as required follow culture data adjust antibiotics accordingly 2. Chronic obstructive pulmonary disease exacerbation: Provide DuoNeb treatments steroids. Continue with home medications.  3. Atrial fibrillation rapid ventricular response: Appreciate cardiology input converted to normal sinus currently on amiodarone-cardiology to decide on anticoagulation, question if she would be a poor candidate apparently has had brain aneurysm with clipping in the past 4. Tobacco abuse: Discussed multiple options about quitting, patient has apparently tried Chantix, nicotine patch supplementation, electronic cigarette, various other methods of past however desire to quit was not present at that time. Have discussed how smoking is judgment due to her health particularly her respiratory status given COPD and could likely guarantee further admissions to the hospital. While in the hospital she is agreeable to try nicotine replacement-total counseling time spent greater than 10 minutes     Transfer out of intensive care and   All the records are reviewed and case discussed with Care Management/Social Workerr. Management plans discussed with the patient, family and they are in agreement.  CODE STATUS: Full  TOTAL TIME TAKING CARE OF THIS PATIENT: 38 minutes.   POSSIBLE D/C IN 1-2 DAYS, DEPENDING ON CLINICAL CONDITION.   Terea Neubauer,  Karenann Cai.D on 02/24/2016 at 1:53 PM  Between 7am to 6pm - Pager - [phone]  After 6pm: House Pager: - [phone]  [facility]  Office   [phone]  CC: Primary care physician; Kathrine Haddock, NP

## 2016-02-24 NOTE — Progress Notes (Signed)
*  PRELIMINARY RESULTS* Echocardiogram 2D Echocardiogram has been performed.  Laqueta Jean Hege 02/24/2016, 3:05 PM

## 2016-02-24 NOTE — Progress Notes (Signed)
Chaplain rounded the unit and provided a compassionate presence and spiritual support to the patient.  Chaplain Classie Weng [phone] 

## 2016-02-25 DIAGNOSIS — F419 Anxiety disorder, unspecified: Secondary | ICD-10-CM

## 2016-02-25 LAB — BASIC METABOLIC PANEL WITH GFR
Anion gap: 3 — ABNORMAL LOW (ref 5–15)
BUN: 31 mg/dL — ABNORMAL HIGH (ref 6–20)
CO2: 19 mmol/L — ABNORMAL LOW (ref 22–32)
Calcium: 8.4 mg/dL — ABNORMAL LOW (ref 8.9–10.3)
Chloride: 116 mmol/L — ABNORMAL HIGH (ref 101–111)
Creatinine, Ser: 0.73 mg/dL (ref 0.44–1.00)
GFR calc Af Amer: >60 mL/min (ref >60)
GFR calc non Af Amer: >60 mL/min (ref >60)
Glucose, Bld: 139 mg/dL — ABNORMAL HIGH (ref 65–99)
Potassium: 3.7 mmol/L (ref 3.5–5.1)
Sodium: 138 mmol/L (ref 135–145)

## 2016-02-25 LAB — CBC
HCT: 31 % — ABNORMAL LOW (ref 35.0–47.0)
Hemoglobin: 10.2 g/dL — ABNORMAL LOW (ref 12.0–16.0)
MCH: 29.3 pg (ref 26.0–34.0)
MCHC: 32.7 g/dL (ref 32.0–36.0)
MCV: 89.6 fL (ref 80.0–100.0)
Platelets: 211 K/uL (ref 150–440)
RBC: 3.46 MIL/uL — ABNORMAL LOW (ref 3.80–5.20)
RDW: 16.7 % — ABNORMAL HIGH (ref 11.5–14.5)
WBC: 23.9 K/uL — ABNORMAL HIGH (ref 3.6–11.0)

## 2016-02-25 MED ORDER — ZOLPIDEM TARTRATE 5 MG PO TABS
5.0000 mg | ORAL_TABLET | Freq: Every evening | ORAL | Status: DC | PRN
Start: 2016-02-25 — End: 2016-02-25
  Administered 2016-02-25: 5 mg via ORAL
  Filled 2016-02-25: qty 1

## 2016-02-25 MED ORDER — TRAZODONE HCL 100 MG PO TABS
100.0000 mg | ORAL_TABLET | Freq: Every evening | ORAL | Status: DC | PRN
  Administered 2016-02-25: 100 mg via ORAL
  Filled 2016-02-25: qty 1

## 2016-02-25 MED ORDER — ENOXAPARIN SODIUM 40 MG/0.4ML ~~LOC~~ SOLN
40.0000 mg | SUBCUTANEOUS | Status: DC
  Administered 2016-02-25 – 2016-02-27 (×3): 40 mg via SUBCUTANEOUS
  Filled 2016-02-25 (×3): qty 0.4

## 2016-02-25 MED ORDER — OXYCODONE HCL 5 MG PO TABS
5.0000 mg | ORAL_TABLET | ORAL | Status: DC | PRN
  Administered 2016-02-25 – 2016-02-28 (×5): 5 mg via ORAL
  Filled 2016-02-25 (×5): qty 1

## 2016-02-25 MED ORDER — MORPHINE SULFATE (PF) 2 MG/ML IV SOLN
2.0000 mg | INTRAVENOUS | Status: DC | PRN
  Administered 2016-02-25 – 2016-02-26 (×2): 2 mg via INTRAVENOUS
  Filled 2016-02-25 (×2): qty 1

## 2016-02-25 MED ORDER — GUAIFENESIN-CODEINE 100-10 MG/5ML PO SOLN
10.0000 mL | ORAL | Status: DC | PRN
  Administered 2016-02-26 – 2016-02-27 (×2): 10 mL via ORAL
  Filled 2016-02-25 (×2): qty 10

## 2016-02-25 NOTE — Care Management (Signed)
Presents from home with shortness of breath.  Admitted to icu  on cardizem drip which has since been discontinued.  Being treated for pneumonia and positive blood cultures.  Consulting ID.   She lives alone and is worried about her pets.   Discussed the need to mobilize patient and assess for need of home 02

## 2016-02-25 NOTE — Progress Notes (Signed)
Patient: Paula Krause / Admit Date: 02/23/2016 / Date of Encounter: 02/25/2016, 1:09 PM   Subjective: This morning, some agitation, did not sleep well, dramatic increase in her coughing, on nasal cannula oxygen. Granddaughter at the bedside, trying to calm patient, she wants to go home to take care of dogs. Not even her dogs, grand daughters dogs. Granddaughter continues to insist she does not need to take care of the dogs.  Telemetry reviewed showing normal sinus rhythm with APCs   Review of Systems: Review of Systems  Constitutional: Negative.   Respiratory: Positive for cough, sputum production, shortness of breath and wheezing.   Cardiovascular: Negative.   Gastrointestinal: Negative.   Musculoskeletal: Negative.   Neurological: Negative.   Psychiatric/Behavioral: The patient is nervous/anxious.   All other systems reviewed and are negative.   Objective: Telemetry:  Physical Exam: Blood pressure 90/46, pulse 91, temperature 98 F (36.7 C), temperature source Oral, resp. rate 20, height 5\' 3"  (1.6 m), weight 131 lb 14.4 oz (59.829 kg), SpO2 90 %. Body mass index is 23.37 kg/(m^2). General: Frail appearing, in no acute distress. Appears anxious Head: Normocephalic, atraumatic, sclera non-icteric, no xanthomas, nares are without discharge. Neck: Negative for carotid bruits. JVD not elevated. Lungs: Coarse breath sounds right-sided. Breathing is unlabored. Heart: RRR with S1 S2. No murmurs, rubs, or gallops appreciated. Abdomen: Soft, non-tender, non-distended with normoactive bowel sounds. No hepatomegaly. No rebound/guarding. No obvious abdominal masses. Msk: Strength and tone appear normal for age. Extremities: No clubbing or cyanosis. No edema. Distal pedal pulses are 2+ and equal bilaterally. Neuro: Alert and oriented X 3. No facial asymmetry. No focal deficit. Moves all extremities spontaneously. Psych: Responds to questions, more anxious, agitated this  morning   Intake/Output Summary (Last 24 hours) at 02/25/16 1309 Last data filed at 02/25/16 1005  Gross per 24 hour  Intake 1220.83 ml  Output    750 ml  Net 470.83 ml    Inpatient Medications:  . amiodarone  400 mg Oral BID  . cefTRIAXone (ROCEPHIN)  IV  2 g Intravenous Q24H  . Chlorhexidine Gluconate Cloth  6 each Topical Q0600  . levalbuterol  1.25 mg Nebulization Q6H  . mometasone-formoterol  2 puff Inhalation BID  . mupirocin ointment  1 application Nasal BID  . nicotine  21 mg Transdermal Daily  . pantoprazole  40 mg Oral Daily  . predniSONE  40 mg Oral Q breakfast   Followed by  . [START ON 02/27/2016] predniSONE  30 mg Oral Q breakfast   Followed by  . [START ON 02/29/2016] predniSONE  20 mg Oral Q breakfast   Followed by  . [START ON 03/02/2016] predniSONE  10 mg Oral Q breakfast  . sodium chloride flush  3 mL Intravenous Q12H   Infusions:  . sodium chloride 125 mL/hr at 02/24/16 2255    Labs:  Recent Labs  02/24/16 0414 02/25/16 0510  NA 138 138  K 3.6 3.7  CL 112* 116*  CO2 18* 19*  GLUCOSE 187* 139*  BUN 44* 31*  CREATININE 0.95 0.73  CALCIUM 7.9* 8.4*  MG 2.1  --     Recent Labs  02/23/16 1652 02/24/16 0414  AST 50* 33  ALT 33 26  ALKPHOS 168* 110  BILITOT 1.2 1.1  PROT 7.2 5.2*  ALBUMIN 2.9* 2.0*    Recent Labs  02/23/16 1652 02/25/16 0510  WBC 24.6* 23.9*  NEUTROABS 23.8*  --   HGB 13.2 10.2*  HCT 40.7 31.0*  MCV 89.6 89.6  PLT 313 211    Recent Labs  02/23/16 1652 02/24/16 0049 02/24/16 0755  TROPONINI 0.06* <0.03 <0.03   Invalid input(s): POCBNP  Recent Labs  02/24/16 0414  HGBA1C 5.9     Weights: Filed Weights   02/23/16 1640 02/23/16 1642 02/24/16 1642  Weight: 106 lb (48.081 kg) 124 lb (56.246 kg) 131 lb 14.4 oz (59.829 kg)     Radiology/Studies:  Dg Chest Portable 1 View  02/23/2016  CLINICAL DATA:  Cough and shortness of breath.  Fever and chills. EXAM: PORTABLE CHEST 1 VIEW COMPARISON:  09/02/2013  FINDINGS: There is a hazy right basilar opacity at least affecting the right middle lobe with obscuration of the obscuring the right heart border. No air bronchograms the given appearance and history still most likely pneumonia. No layering pleural fluid. No pulmonary edema. Normal heart size and aortic contours. IMPRESSION: Right basilar pneumonia. Electronically Signed   By: Monte Fantasia M.D.   On: 02/23/2016 17:48     Assessment and Plan  75 year old woman presenting Pneumonia, right upper quadrant discomfort, cough,  continues to smoke one pack per day.  Presented to the emergency room with hypotension, narrow complex tachycardia, given adenosine, then started on diltiazem infusion for atrial fibrillation/flutter,  Secondary to hypotension, she was changed to amiodarone  Clinical exam notable for rales at the bases bilaterally,  heart sounds regular, some ectopy  abdomen otherwise soft and nondistended, no leg edema   ---Atrial fibrillation with RVR Likely secondary to underlying infection Would continue oral amiodarone to maintain normal sinus rhythm 400 mg twice a day Length of atrial fibrillation unclear as symptoms started 5 days ago with malaise, chills fever, she was unaware of tachycardia. Anticoagulation discussed with patient, she does not want xarelto and believes she has an aneurysm in her head that might rupture. We'll try to maintain normal rhythm  --- Sepsis/pneumonia Hypotension on arrival, receiving fluid resuscitation, on broad-spectrum antibiotics She did receive vancomycin and Zosyn in the emergency room, now on levofloxacin Blood pressures improving  --- Right upper quadrant pain on arrival Etiology unclear, possibly from pneumonia, unable to exclude gallbladder disease  ----COPD exacerbation Chronic cough, long smoking history, continues to smoke one pack per day She has indicated she would like smoking patch  --- Smoker Again recommended smoking  cessation   Signed, Esmond Plants, MD, Ph.D. [facility] 02/25/2016, 1:09 PM

## 2016-02-25 NOTE — Progress Notes (Signed)
[facility] [place] NAME: Paula Krause    MR#:  [id number]  DATE OF BIRTH:  Apr 24, 1941  SUBJECTIVE:  No overnight events, complaints of cough, confused this morning after receiving Ambien overnight patient with visual hallucinations raining inside the room, small dogs under the table  REVIEW OF SYSTEMS:  CONSTITUTIONAL: No fever, positive fatigue or weakness.  EYES: No blurred or double vision.  EARS, NOSE, AND THROAT: No tinnitus or ear pain.  RESPIRATORY: Positive cough, shortness of breath, wheezing denies hemoptysis.  CARDIOVASCULAR: No chest pain, orthopnea, edema.  GASTROINTESTINAL: No nausea, vomiting, diarrhea or abdominal pain.  GENITOURINARY: No dysuria, hematuria.  ENDOCRINE: No polyuria, nocturia,  HEMATOLOGY: No anemia, easy bruising or bleeding SKIN: No rash or lesion. MUSCULOSKELETAL: No joint pain or arthritis.   NEUROLOGIC: No tingling, numbness, weakness.  PSYCHIATRY: No anxiety or depression.   DRUG ALLERGIES:  No Known Allergies  VITALS:  Blood pressure 90/46, pulse 91, temperature 98 F (36.7 C), temperature source Oral, resp. rate 20, height 5\' 3"  (1.6 m), weight 59.829 kg (131 lb 14.4 oz), SpO2 90 %.  PHYSICAL EXAMINATION:  VITAL SIGNS: Filed Vitals:   02/25/16 0538 02/25/16 0800  BP: 99/54 90/46  Pulse: 93 91  Temp: 97.6 F (36.4 C) 98 F (36.7 C)  Resp: 20 20   GENERAL:75 y.o.female currently in no acute distress.  HEAD: Normocephalic, atraumatic.  EYES: Pupils equal, round, reactive to light. Extraocular muscles intact. No scleral icterus.  MOUTH: Moist mucosal membrane. Dentition intact. No abscess noted.  EAR, NOSE, THROAT: Clear without exudates. No external lesions.  NECK: Supple. No thyromegaly. No nodules. No JVD.  PULMONARY:Diminished breath sounds throughout Right base coarse rhonchi with  without wheeze rails  No use of accessory muscles, Good respiratory effort. good air entry  bilaterally CHEST: Nontender to palpation.  CARDIOVASCULAR: S1 and S2. Regular rate and rhythm No murmurs, rubs, or gallops. No edema. Pedal pulses 2+ bilaterally.  GASTROINTESTINAL: Soft, nontender, nondistended. No masses. Positive bowel sounds. No hepatosplenomegaly.  MUSCULOSKELETAL: No swelling, clubbing, or edema. Range of motion full in all extremities.  NEUROLOGIC: Cranial nerves II through XII are intact. No gross focal neurological deficits. Sensation intact. Reflexes intact.  SKIN: No ulceration, lesions, rashes, or cyanosis. Skin warm and dry. Turgor intact.  PSYCHIATRIC: Mood, affect within normal limits. The patient is awake, alert and oriented x 3. Insight, judgment intact.      LABORATORY PANEL:   CBC  Recent Labs Lab 02/25/16 0510  WBC 23.9*  HGB 10.2*  HCT 31.0*  PLT 211   ------------------------------------------------------------------------------------------------------------------  Chemistries   Recent Labs Lab 02/24/16 0414 02/25/16 0510  NA 138 138  K 3.6 3.7  CL 112* 116*  CO2 18* 19*  GLUCOSE 187* 139*  BUN 44* 31*  CREATININE 0.95 0.73  CALCIUM 7.9* 8.4*  MG 2.1  --   AST 33  --   ALT 26  --   ALKPHOS 110  --   BILITOT 1.1  --    ------------------------------------------------------------------------------------------------------------------  Cardiac Enzymes  Recent Labs Lab 02/24/16 0755  TROPONINI <0.03   ------------------------------------------------------------------------------------------------------------------  RADIOLOGY:  Dg Chest Portable 1 View  02/23/2016  CLINICAL DATA:  Cough and shortness of breath.  Fever and chills. EXAM: PORTABLE CHEST 1 VIEW COMPARISON:  09/02/2013 FINDINGS: There is a hazy right basilar opacity at least affecting the right middle lobe with obscuration of the obscuring the right heart border. No air bronchograms the given appearance and  history still most likely pneumonia. No layering pleural  fluid. No pulmonary edema. Normal heart size and aortic contours. IMPRESSION: Right basilar pneumonia. Electronically Signed   By: Monte Fantasia M.D.   On: 02/23/2016 17:48    EKG:   Orders placed or performed during the hospital encounter of 02/23/16  . EKG 12-Lead  . EKG 12-Lead    ASSESSMENT AND PLAN:   [name] history of COPD and tobacco abuse presenting with shortness of breath admitted 02/15/2016  1. Sepsis: Meeting septic criteria on arrival by heart rate respiratory rateLeukocytosis Secondary to community-acquired pneumonia, strep pneumo bacteremia: We'll consult infectious disease patient already had an echocardiogram performed for other issues no infective endocarditis noted this would be a fairly rare cause less than 3% for endocarditis 2. Chronic obstructive pulmonary disease exacerbation: Provide DuoNeb treatments steroids. Continue with home medications.  3. Paroxysmal Atrial fibrillation remains in normal sinus rhythm cardiology input appreciated 4. Tobacco abuse:    All the records are reviewed and case discussed with Care Management/Social Workerr. Management plans discussed with the patient, family and they are in agreement.  CODE STATUS: Full  TOTAL TIME TAKING CARE OF THIS PATIENT: 40minutes.   POSSIBLE D/C IN 1-2 DAYS, DEPENDING ON CLINICAL CONDITION.   Hower,  Karenann Cai.D on 02/25/2016 at 2:37 PM  Between 7am to 6pm - Pager - [phone]  After 6pm: House Pager: - [phone]  [facility]  Office  [phone]  CC: Primary care physician; Kathrine Haddock, NP

## 2016-02-26 MED ORDER — DOCUSATE SODIUM 100 MG PO CAPS
100.0000 mg | ORAL_CAPSULE | Freq: Two times a day (BID) | ORAL | Status: DC
  Administered 2016-02-26 – 2016-02-28 (×4): 100 mg via ORAL
  Filled 2016-02-26 (×4): qty 1

## 2016-02-26 MED ORDER — POLYETHYLENE GLYCOL 3350 17 G PO PACK: 17.0000 g | PACK | Freq: Every day | ORAL | Status: DC | PRN

## 2016-02-26 NOTE — Consult Note (Signed)
   [facility] CM Inpatient Consult   02/26/2016  Paula Krause [birth year] [id number]   Patient screened for potential [place] Management services. Patient is eligible for [place]. Epic reveals patient's discharge plan is possible SNF placement.  [facility] Care Management services not appropriate at this time. If patient's post hospital needs change please place a [facility] Care Management consult. For questions please contact:   Nadiya Pieratt RN, [facility] Liaison  [phone]) Business Mobile [phone]) Toll free office

## 2016-02-26 NOTE — Progress Notes (Signed)
Notified physician about patient not having a bowel movement since admission. New orders placed.

## 2016-02-26 NOTE — Evaluation (Signed)
Physical Therapy Evaluation Patient Details Name: Paula Krause MRN: [id number] DOB: 23-Jun-1941 Today's Date: 02/26/2016   History of Present Illness  Pt is a pleasant 75 y.o. F admitted to the hospital for sepsis, A-fib, pneumonia, and centrilobular emphysema. Pt also diagnosed with MRSA. Pt has hx of COPD, depression, anxiety, and GERD. Pt lives alone with family in the area. Prior to admission, pt performed all ADLs independently. Has not previously used AD for ambulation. Pt stated only uses O2 at night at home.    Clinical Impression  Pt is a pleasant 75 y.o. F admitted to hospital for sepsis, A-fib, pneumonia, centrilobular emphysema, and MRSA. Pt has hx of COPD, depression, anxiety, and GERD. Prior to admission, pt performed ADLs independently. Pt lives at home alone with family nearby. Family only available in the evenings as they work during the day. Upon start of session, pts O2 sat at 96% on 3L. After ambulation, O2 dropped to 90% on 3L. Pt demonstrated SOB t/o treatment, increasing with ambulation. Pt demonstrated good B UE and LE, with decreased motion in B UE and L LE.  Pt able to perform bed mobility independently, however very unsafe and impulsive. Pt able to perform sit to stand transfer at EOB with RW and CGA. Needed heavy cues for sequencing and hand placement with walker. Pt impulsive and stood up prior to therapist's instruction. Pt able to ambulate approx 15 ft in room with RW and min assist. Pt cued on use of walker and speed t/o ambulation to avoid LOB. After ambulation, pt demonstrated extreme fatigue with UE shaking due to fatigue. Pt unfamiliar with AD, however think pt would benefit from using RW until endurance and strength improve. Pt motivated to participate in therapy. Pt demonstrates deficits in strength, ROM, mobility, balance, and use of assistive device. Pt would benefit from further skilled therapy to address deficits; recommend pt be sent to SNF after discharge from  acute hospitalization.     Follow Up Recommendations SNF    Equipment Recommendations  Rolling walker with 5" wheels    Recommendations for Other Services       Precautions / Restrictions Precautions Precautions: Fall Restrictions Weight Bearing Restrictions: No      Mobility  Bed Mobility Overal bed mobility: Independent             General bed mobility comments: Pt able to perform bed mobility independently, using UE to help raise trunk. However, pt unsafe and impulsive t/o movement.   Transfers Overall transfer level: Modified independent Equipment used: Rolling walker (2 wheeled)             General transfer comment: Pt able to perform sit to stand transfer from EOB using RW and CGA for safety. Pt impulsive and stood up prior to therapist's instruction.  Ambulation/Gait Ambulation/Gait assistance: Min assist Ambulation Distance (Feet): 15 Feet Assistive device: Rolling walker (2 wheeled) Gait Pattern/deviations: Step-through pattern     General Gait Details: Pt able to ambulate using RW and min assist. Pt required cueing on use of the walker and awareness of environment. Pt became unsteady when trying to turn w/walker. Pt unfamiliar with use of AD w/ambulation. Pt demonstrated decreased endurance w/ambulation. Ambulation limited due to pts SOB and fatigue.    Stairs            Wheelchair Mobility    Modified Rankin (Stroke Patients Only)       Balance Overall balance assessment: Independent  Pertinent Vitals/Pain Pain Assessment: 0-10 Pain Score: 7  Pain Location: in R side, states due to pneumonia  Pain Descriptors / Indicators: Cramping;Jabbing Pain Intervention(s): Limited activity within patient's tolerance    Home Living Family/patient expects to be discharged to:: Private residence Living Arrangements: Alone Available Help at Discharge: Family Type of Home:  Apartment Home Access: Level entry     Home Layout: One level Home Equipment: None Additional Comments: Pt has family near by, however they work during the day.     Prior Function Level of Independence: Independent               Hand Dominance        Extremity/Trunk Assessment   Upper Extremity Assessment: RUE deficits/detail;LUE deficits/detail RUE Deficits / Details: Pt demonstrates good grip strength on R, grossly 4+/5 strength with elbow flex/ext. Limited ROM w/shoulder flex.     LUE Deficits / Details: Pt demonstrates good grip strength on L, grossly 4+/5 elbow flex/ext, limited ROM w/shoulder flex   Lower Extremity Assessment: RLE deficits/detail;LLE deficits/detail RLE Deficits / Details: Grossly 4/5 strength  LLE Deficits / Details: grossly 4-/5 strength in LLE, limited knee flexion      Communication   Communication: No difficulties  Cognition Arousal/Alertness: Awake/alert Behavior During Therapy: Anxious Overall Cognitive Status: Within Functional Limits for tasks assessed                      General Comments      Exercises        Assessment/Plan    PT Assessment Patient needs continued PT services  PT Diagnosis Difficulty walking;Abnormality of gait;Generalized weakness;Acute pain   PT Problem List Decreased strength;Decreased range of motion;Decreased activity tolerance;Decreased mobility;Decreased knowledge of use of DME  PT Treatment Interventions DME instruction;Gait training;Therapeutic exercise   PT Goals (Current goals can be found in the Care Plan section) Acute Rehab PT Goals Patient Stated Goal: To return home and performing ADLs independently.  PT Goal Formulation: With patient Time For Goal Achievement: 03/11/16 Potential to Achieve Goals: Good    Frequency Min 2X/week   Barriers to discharge Decreased caregiver support Pt lives at home with family available only at night. Pt unable to perform mobility and ADLs safely  w/o assist.     Co-evaluation               End of Session Equipment Utilized During Treatment: Gait belt Activity Tolerance: Patient limited by fatigue Patient left: in chair;with chair alarm set;with family/visitor present           Time: [id number] PT Time Calculation (min) (ACUTE ONLY): 17 min   Charges:   PT Evaluation $PT Eval Moderate Complexity: 1 Procedure     PT G Codes:        Sherral Hammers March 17, 2016, 3:17 PM M. Barnett Abu, SPT

## 2016-02-26 NOTE — Progress Notes (Signed)
[place] [facility] [place] NAME: Paula Krause    MR#:  [id number]  DATE OF BIRTH:  05/05/1941  SUBJECTIVE:  Still complaining of right side pain, cough, shortness of breath currently on supplemental oxygen  REVIEW OF SYSTEMS:  CONSTITUTIONAL: No fever, positive fatigue or weakness.  EYES: No blurred or double vision.  EARS, NOSE, AND THROAT: No tinnitus or ear pain.  RESPIRATORY: Positive cough, shortness of breath, wheezing denies hemoptysis.  CARDIOVASCULAR: No chest pain, orthopnea, edema.  GASTROINTESTINAL: No nausea, vomiting, diarrhea or abdominal pain.  GENITOURINARY: No dysuria, hematuria.  ENDOCRINE: No polyuria, nocturia,  HEMATOLOGY: No anemia, easy bruising or bleeding SKIN: No rash or lesion. MUSCULOSKELETAL: No joint pain or arthritis.   NEUROLOGIC: No tingling, numbness, weakness.  PSYCHIATRY: No anxiety or depression.   DRUG ALLERGIES:  No Known Allergies  VITALS:  Blood pressure 115/52, pulse 78, temperature 97.8 F (36.6 C), temperature source Oral, resp. rate 18, height 5\' 3"  (1.6 m), weight 59.829 kg (131 lb 14.4 oz), SpO2 94 %.  PHYSICAL EXAMINATION:  VITAL SIGNS: Filed Vitals:   02/26/16 0516 02/26/16 1133  BP: 110/69 115/52  Pulse: 94 78  Temp: 98.2 F (36.8 C) 97.8 F (36.6 C)  Resp: 19 18   GENERAL:75 y.o.female currently in no acute distress.  HEAD: Normocephalic, atraumatic.  EYES: Pupils equal, round, reactive to light. Extraocular muscles intact. No scleral icterus.  MOUTH: Moist mucosal membrane. Dentition intact. No abscess noted.  EAR, NOSE, THROAT: Clear without exudates. No external lesions.  NECK: Supple. No thyromegaly. No nodules. No JVD.  PULMONARY:Diminished breath sounds throughout Right base coarse rhonchi with  without wheeze rails  No use of accessory muscles, Good respiratory effort. good air entry bilaterally CHEST: Nontender to palpation.  CARDIOVASCULAR: S1 and S2. Regular  rate and rhythm No murmurs, rubs, or gallops. No edema. Pedal pulses 2+ bilaterally.  GASTROINTESTINAL: Soft, nontender, nondistended. No masses. Positive bowel sounds. No hepatosplenomegaly.  MUSCULOSKELETAL: No swelling, clubbing, or edema. Range of motion full in all extremities.  NEUROLOGIC: Cranial nerves II through XII are intact. No gross focal neurological deficits. Sensation intact. Reflexes intact.  SKIN: No ulceration, lesions, rashes, or cyanosis. Skin warm and dry. Turgor intact.  PSYCHIATRIC: Mood, affect within normal limits. The patient is awake, alert and oriented x 3. Insight, judgment intact.      LABORATORY PANEL:   CBC  Recent Labs Lab 02/25/16 0510  WBC 23.9*  HGB 10.2*  HCT 31.0*  PLT 211   ------------------------------------------------------------------------------------------------------------------  Chemistries   Recent Labs Lab 02/24/16 0414 02/25/16 0510  NA 138 138  K 3.6 3.7  CL 112* 116*  CO2 18* 19*  GLUCOSE 187* 139*  BUN 44* 31*  CREATININE 0.95 0.73  CALCIUM 7.9* 8.4*  MG 2.1  --   AST 33  --   ALT 26  --   ALKPHOS 110  --   BILITOT 1.1  --    ------------------------------------------------------------------------------------------------------------------  Cardiac Enzymes  Recent Labs Lab 02/24/16 0755  TROPONINI <0.03   ------------------------------------------------------------------------------------------------------------------  RADIOLOGY:  No results found.  EKG:   Orders placed or performed during the hospital encounter of 02/23/16  . EKG 12-Lead  . EKG 12-Lead    ASSESSMENT AND PLAN:   [name] history of COPD and tobacco abuse presenting with shortness of breath admitted 02/15/2016  1. Sepsis: Meeting septic criteria on arrival by heart rate respiratory rateLeukocytosis Secondary to community-acquired pneumonia, strep pneumo bacteremia: We'll consult  infectious disease patient  already had an echocardiogram performed for other issues no infective endocarditis noted this would be a fairly rare cause less than 3% for endocarditisContinue current antibiotic regiment 2. Chronic obstructive pulmonary disease exacerbation: Provide DuoNeb treatments steroids. Continue with home medications.  3. Paroxysmal Atrial fibrillation remains in normal sinus rhythm cardiology input appreciated 4. Tobacco abuse:    All the records are reviewed and case discussed with Care Management/Social Workerr. Management plans discussed with the patient, family and they are in agreement.  CODE STATUS: Full  TOTAL TIME TAKING CARE OF THIS PATIENT: 39minutes.   POSSIBLE D/C IN 1-2 DAYS, DEPENDING ON CLINICAL CONDITION.   Ramir Malerba,  Karenann Cai.D on 02/26/2016 at 3:30 PM  Between 7am to 6pm - Pager - [phone]  After 6pm: House Pager: - [phone]  [facility]  Office  [phone]  CC: Primary care physician; Kathrine Haddock, NP

## 2016-02-26 NOTE — Care Management Important Message (Signed)
Important Message  Patient Details  Name: Paula Krause MRN: [id number] Date of Birth: 06/14/1941   Medicare Important Message Given:  Yes    Jolly Mango, RN 02/26/2016, 12:19 PM

## 2016-02-27 ENCOUNTER — Inpatient Hospital Stay: Payer: Commercial Managed Care - HMO

## 2016-02-27 LAB — CBC
HCT: 35.6 % (ref 35.0–47.0)
Hemoglobin: 11.6 g/dL — ABNORMAL LOW (ref 12.0–16.0)
MCH: 28.9 pg (ref 26.0–34.0)
MCHC: 32.6 g/dL (ref 32.0–36.0)
MCV: 88.7 fL (ref 80.0–100.0)
PLATELETS: 286 10*3/uL (ref 150–440)
RBC: 4.01 MIL/uL (ref 3.80–5.20)
RDW: 17.4 % — ABNORMAL HIGH (ref 11.5–14.5)
WBC: 27.4 10*3/uL — AB (ref 3.6–11.0)

## 2016-02-27 LAB — BASIC METABOLIC PANEL
ANION GAP: 6 (ref 5–15)
BUN: 21 mg/dL — ABNORMAL HIGH (ref 6–20)
CALCIUM: 8.3 mg/dL — AB (ref 8.9–10.3)
CO2: 20 mmol/L — ABNORMAL LOW (ref 22–32)
Chloride: 114 mmol/L — ABNORMAL HIGH (ref 101–111)
Creatinine, Ser: 0.58 mg/dL (ref 0.44–1.00)
GLUCOSE: 84 mg/dL (ref 65–99)
POTASSIUM: 4.4 mmol/L (ref 3.5–5.1)
SODIUM: 140 mmol/L (ref 135–145)

## 2016-02-27 LAB — BRAIN NATRIURETIC PEPTIDE: B NATRIURETIC PEPTIDE 5: 1840 pg/mL — AB (ref 0.0–100.0)

## 2016-02-27 MED ORDER — FUROSEMIDE 10 MG/ML IJ SOLN
20.0000 mg | Freq: Two times a day (BID) | INTRAMUSCULAR | Status: DC
Start: 2016-02-28 — End: 2016-02-28
  Administered 2016-02-28: 20 mg via INTRAVENOUS
  Filled 2016-02-27: qty 2

## 2016-02-27 MED ORDER — FUROSEMIDE 10 MG/ML IJ SOLN
40.0000 mg | Freq: Once | INTRAMUSCULAR | Status: AC
  Administered 2016-02-27: 40 mg via INTRAVENOUS
  Filled 2016-02-27: qty 4

## 2016-02-27 MED ORDER — FUROSEMIDE 10 MG/ML IJ SOLN
20.0000 mg | Freq: Once | INTRAMUSCULAR | Status: AC
  Administered 2016-02-27: 20 mg via INTRAVENOUS

## 2016-02-27 MED ORDER — FUROSEMIDE 10 MG/ML IJ SOLN
INTRAMUSCULAR | Status: AC
  Administered 2016-02-27: 20 mg via INTRAVENOUS
  Filled 2016-02-27: qty 2

## 2016-02-27 MED ORDER — LEVALBUTEROL HCL 1.25 MG/0.5ML IN NEBU
1.2500 mg | INHALATION_SOLUTION | RESPIRATORY_TRACT | Status: DC
  Administered 2016-02-27: 1.25 mg via RESPIRATORY_TRACT
  Filled 2016-02-27: qty 0.5

## 2016-02-27 MED ORDER — LEVOFLOXACIN IN D5W 750 MG/150ML IV SOLN
750.0000 mg | INTRAVENOUS | Status: DC
  Administered 2016-02-27 – 2016-02-28 (×2): 750 mg via INTRAVENOUS
  Filled 2016-02-27 (×3): qty 150

## 2016-02-27 MED ORDER — ALPRAZOLAM 0.25 MG PO TABS
0.2500 mg | ORAL_TABLET | Freq: Two times a day (BID) | ORAL | Status: DC | PRN
  Administered 2016-02-27: 0.25 mg via ORAL
  Filled 2016-02-27: qty 1

## 2016-02-27 NOTE — Progress Notes (Signed)
Summoned to pt room while n.t. Taking her to b.r. She was short of breath and very anxious. Sats86. Lungs very wheezy. Given svn rx. Dr. Benjie Karvonen notified. ivf's stopped and 20 lasix given iv. Xanax ordered for anxiety. sats 90-91 after svn. Pt more relaxed

## 2016-02-27 NOTE — Progress Notes (Signed)
Pharmacy Antibiotic Follow-up Note  Paula Krause is a 75 y.o. year-old female admitted on 02/23/2016.  The patient is currently being treated for sepsis, bacteremia, and CAP.  Assessment/Plan: Patient currently on ceftriaxone for strep pneumo bacteremia, now being broadened to levaquin for CAP and bacteremia and sepsis.   Will initiate levaquin 750mg  IV q24hrs due to severity of infection. Patient's renal function is borderline for dose reduction, will continue to monitor.  Temp (24hrs), Avg:97.8 F (36.6 C), Min:97.8 F (36.6 C), Max:97.8 F (36.6 C)   Recent Labs Lab 02/23/16 1652 02/25/16 0510 02/27/16 0526  WBC 24.6* 23.9* 27.4*     Recent Labs Lab 02/23/16 1652 02/24/16 0414 02/25/16 0510 02/27/16 0526  CREATININE 1.41* 0.95 0.73 0.58   Estimated Creatinine Clearance: 51.8 mL/min (by C-G formula based on Cr of 0.58).    No Known Allergies  Antimicrobials this admission: Levaquin 4/1>> Ceftriaxone 3/29>>4/1  Microbiology results: 3/29 BCx: Strep Pneumo growing in Anaerobic and aerobic bottles  UCx:   Sputum:   MRSA PCR:   Thank you for allowing pharmacy to be a part of this patient's care.  Vena Rua PharmD 02/27/2016 9:13 AM

## 2016-02-27 NOTE — Progress Notes (Signed)
Patient's granddaughter requested to speak to MD regarding patient's care and health status. Notified Prime MD and told him of the situation. MD came and spoke with the patient and her family. Orders for STAT chest xray, BMP, and BNP placed. Will notify MD of results and await further orders. No other concerns from the patient or family at this time. Nursing staff will continue to monitor. Earleen Reaper, RN

## 2016-02-27 NOTE — Progress Notes (Addendum)
[place] at [place] NAME: Paula Krause    MR#:  [id number]  DATE OF BIRTH:  12/21/1940  SUBJECTIVE:  Patient is stating that she thinks that others are talking about her and she is getting anxious Nurse reports after Lorrin Mais given 2 days ago, she has been hallucinating  REVIEW OF SYSTEMS:  CONSTITUTIONAL: No fever, positive fatigue and generalizedor weakness.  EYES: No blurred or double vision.  EARS, NOSE, AND THROAT: No tinnitus or ear pain.  RESPIRATORY: Positive cough, shortness of breath, wheezing No hemoptysis.  CARDIOVASCULAR: No chest pain, orthopnea, edema.  GASTROINTESTINAL: No nausea, vomiting, diarrhea or abdominal pain.  GENITOURINARY: No dysuria, hematuria.  ENDOCRINE: No polyuria, nocturia,  HEMATOLOGY: No anemia, easy bruising or bleeding SKIN: No rash or lesion. MUSCULOSKELETAL: No joint pain or arthritis.   NEUROLOGIC: No tingling, numbness, weakness.  PSYCHIATRY: ?hallcinating  DRUG ALLERGIES:  No Known Allergies  VITALS:  Blood pressure 137/74, pulse 106, temperature 98.2 F (36.8 C), temperature source Oral, resp. rate 20, height 5' 3"  (1.6 m), weight 59.829 kg (131 lb 14.4 oz), SpO2 93 %.  PHYSICAL EXAMINATION:  VITAL SIGNS: Filed Vitals:   02/27/16 0900 02/27/16 1101  BP: 137/77 137/74  Pulse: 102 106  Temp: 98 F (36.7 C) 98.2 F (36.8 C)  Resp: 16 20   GENERAL:75 y.o.female currently in no acute distress.  HEAD: Normocephalic, atraumatic.  EYES: Pupils equal, round, reactive to light. Extraocular muscles intact. No scleral icterus.  MOUTH: Moist mucosal membrane. POOR Dentition No abscess noted.  EAR, NOSE, THROAT: Clear without exudates. No external lesions.  NECK: Supple. No thyromegaly. No nodules. No JVD.  PULMONARY:decreased throughout o wheezing or rales  No use of accessory muscles,   CHEST: Nontender to palpation.  CARDIOVASCULAR: S1 and S2. Regular rate and rhythm No murmurs,  rubs, or gallops. No edema. Pedal pulses 2+ bilaterally.  GASTROINTESTINAL: Soft, nontender, nondistended. No masses. Positive bowel sounds. No hepatosplenomegaly.  MUSCULOSKELETAL: No swelling, clubbing, or edema. Range of motion full in all extremities.  NEUROLOGIC: Cranial nerves II through XII are intact. No gross focal neurological deficits. Sensation intact. Reflexes intact.  SKIN: No ulceration, lesions, rashes, or cyanosis. Skin warm and dry. Turgor intact.  PSYCHIATRIC:  The patient is awake, alert and oriented x 3. Insight, judgment ??     LABORATORY PANEL:   CBC  Recent Labs Lab 02/27/16 0526  WBC 27.4*  HGB 11.6*  HCT 35.6  PLT 286   ------------------------------------------------------------------------------------------------------------------  Chemistries   Recent Labs Lab 02/24/16 0414  02/27/16 0526  NA 138  < > 140  K 3.6  < > 4.4  CL 112*  < > 114*  CO2 18*  < > 20*  GLUCOSE 187*  < > 84  BUN 44*  < > 21*  CREATININE 0.95  < > 0.58  CALCIUM 7.9*  < > 8.3*  MG 2.1  --   --   AST 33  --   --   ALT 26  --   --   ALKPHOS 110  --   --   BILITOT 1.1  --   --   < > = values in this interval not displayed. ------------------------------------------------------------------------------------------------------------------  Cardiac Enzymes  Recent Labs Lab 02/24/16 0755  TROPONINI <0.03   ------------------------------------------------------------------------------------------------------------------  RADIOLOGY:  No results found.  EKG:     ASSESSMENT AND PLAN:   [name] history of COPD and tobacco abuse presenting with shortness of breath  admitted 02/15/2016  1. Sepsis: She met septic criteria on arrival by tachycardia, tachypnea and leukocytosis. Sepsis is due to Streptococcus pneumonia bacteremia and community-acquired pneumonia.  Blood cultures are sensitive to Levaquin and therefore antibiotics have been changed to  Levaquin. ID consult pending. Echocardiogram showed no source of infective endocarditis. WBC elevated this am so antibiotics changed will repeat in am  2. Chronic obstructive pulmonary disease exacerbation: Continue Dulera, Levabuterol and prednisone taper.   3. Paroxysmal Atrial fibrillation remains in normal sinus rhythm cardiology input appreciated Continue amiodarone with plans to decrease dose over the next 1-2 weeks.  4. Tobacco abuse: Continue nicotine patch.     Management plans discussed with the patient and she is  in agreement.  CODE STATUS: Full  TOTAL TIME TAKING CARE OF THIS PATIENT: 28 minutes.   POSSIBLE D/C IN 1-2 DAYS, DEPENDING ON CLINICAL CONDITION. Physical therapy is recommending skilled nursing facility. Clinical social worker consult place.  Kimble Delaurentis M.D on 02/27/2016 at 11:39 AM  Between 7am to 6pm - Pager - [phone]  After 6pm: House Pager: - [phone]  [facility]  Office  [phone]  CC: Primary care physician; Kathrine Haddock, NP

## 2016-02-27 NOTE — Progress Notes (Signed)
40mg  of IV lasix given, patient receiving breathing treatment, and awaiting transport to CT. MD discussed plan of care going forward with the patient and her family to which they are agreeable. Nursing staff will continue to monitor. Earleen Reaper, RN

## 2016-02-27 NOTE — Significant Event (Addendum)
Progress note ===============  Subjective: I was called to see patient at the request of her granddaughter Theadora Rama, who is a Marine scientist at [employer]). Ms. Vargason was admitted on March 28 due to atrial fibrillation with RVR. Patient is also being treated for pneumonia and COPD exacerbation. She was said to have desaturated to 86% earlier in the evening and she was noted to have some pedal edema. IV fluid was discontinued and patient was started on Lasix. At this time, daughter's request and the patient is transferred to [facility].   On physical exam, vital signs are stable and patient had an O2 sat of 92% on 4 L of oxygen. Cardiac exam was unremarkable and chest exam revealed bibasilar crackles and coarse breath sounds.  Physical examination: Filed Vitals:   02/27/16 1101 02/27/16 1951  BP: 137/74 144/85  Pulse: 106 106  Temp: 98.2 F (36.8 C) 98.5 F (36.9 C)  Resp: 20 20   GENERAL:  75 y.o.-year-old patient lying in the bed with no acute distress. Alert and oriented x 3. On 4 L nasal cannula EYES: Pupils equal, round, reactive to light and accommodation. No scleral icterus. Extraocular muscles intact.  HEENT: Head atraumatic, normocephalic. Oropharynx and nasopharynx clear.  NECK:  Supple, no jugular venous distention. No thyroid enlargement, no tenderness.  LUNGS: Decreased air entry in all lung zones with generalized crackles posteriorly. Mild expiratory wheezing present. Right lateral aspect of thorax dull to percussion. CARDIOVASCULAR: S1, S2 normal. No murmurs, rubs, or gallops.  ABDOMEN: Soft, nontender, nondistended. Bowel sounds present. No organomegaly or mass.  EXTREMITIES: 2 + pedal edema up to knees bilaterally, cyanosis, or clubbing.  NEUROLOGIC: Cranial nerves II through XII are intact. Muscle strength 5/5 in all extremities. Sensation intact. Gait not checked.   SKIN: No obvious rash, lesion, or ulcer.   Assessment and plan ================= -  Acute respiratory failure with hypoxia - Community-acquired pneumonia - Acute pulmonary edema - Moderate right-sided pleural effusion  I called [facility] and they do not have any beds on the floor for transfers and only accepting trauma patients in the ED. I have relayed the information to patient's family and at this time, I'm obtaining a chest x-ray, BNP and PEEP, BMP, troponin and EKG. I'll review the x-rays and if patient is in pulmonary edema, I'll give an additional dose of Lasix.  Addendum ==========  Chest x-ray shows moderate right-sided pleural effusion that is new as well as bilateral central opacities that are consistent with pulmonary edema. I'll give patient an additional 40 g of Lasix and continue 20 mg of Lasix twice a day to be discontinued when patient does not require any longer. IR consulted for thoracentesis. CTA pending.  Continue Levaquin.  Add zosyn and vancomycin due to probable empyema.  Critical time spent with patient is 55 minutes

## 2016-02-28 ENCOUNTER — Ambulatory Visit (HOSPITAL_COMMUNITY)
Admission: AD | Admit: 2016-02-28 | Discharge: 2016-02-28 | Disposition: A | Discharge status: Home / Self Care | Payer: Commercial Managed Care - HMO | Source: Other Acute Inpatient Hospital | Attending: Internal Medicine | Admitting: Internal Medicine

## 2016-02-28 ENCOUNTER — Inpatient Hospital Stay: Payer: Commercial Managed Care - HMO

## 2016-02-28 DIAGNOSIS — J189 Pneumonia, unspecified organism: Secondary | ICD-10-CM | POA: Insufficient documentation

## 2016-02-28 LAB — LACTATE DEHYDROGENASE, PLEURAL OR PERITONEAL FLUID: LD FL: 2506 U/L — AB (ref 3–23)

## 2016-02-28 LAB — CBC
HCT: 34.2 % — ABNORMAL LOW (ref 35.0–47.0)
Hemoglobin: 11.4 g/dL — ABNORMAL LOW (ref 12.0–16.0)
MCH: 29 pg (ref 26.0–34.0)
MCHC: 33.3 g/dL (ref 32.0–36.0)
MCV: 86.9 fL (ref 80.0–100.0)
Platelets: 341 10*3/uL (ref 150–440)
RBC: 3.93 MIL/uL (ref 3.80–5.20)
RDW: 17.2 % — AB (ref 11.5–14.5)
WBC: 23.9 10*3/uL — ABNORMAL HIGH (ref 3.6–11.0)

## 2016-02-28 LAB — BASIC METABOLIC PANEL
ANION GAP: 9 (ref 5–15)
BUN: 17 mg/dL (ref 6–20)
CALCIUM: 7.9 mg/dL — AB (ref 8.9–10.3)
CO2: 19 mmol/L — ABNORMAL LOW (ref 22–32)
CREATININE: 0.64 mg/dL (ref 0.44–1.00)
Chloride: 110 mmol/L (ref 101–111)
Glucose, Bld: 115 mg/dL — ABNORMAL HIGH (ref 65–99)
Potassium: 3.3 mmol/L — ABNORMAL LOW (ref 3.5–5.1)
SODIUM: 138 mmol/L (ref 135–145)

## 2016-02-28 LAB — PROTEIN, TOTAL: Total Protein: 5.6 g/dL — ABNORMAL LOW (ref 6.5–8.1)

## 2016-02-28 LAB — BODY FLUID CELL COUNT WITH DIFFERENTIAL
EOS FL: 0 %
LYMPHS FL: 0 %
Monocyte-Macrophage-Serous Fluid: 12 %
NEUTROPHIL FLUID: 88 %
Other Cells, Fluid: 0 %
WBC FLUID: 3037 uL

## 2016-02-28 LAB — PROTEIN, BODY FLUID: Total protein, fluid: <3 g/dL

## 2016-02-28 LAB — TROPONIN I
TROPONIN I: 0.22 ng/mL — AB (ref <0.031)
TROPONIN I: 0.46 ng/mL — AB (ref <0.031)
TROPONIN I: 0.36 ng/mL — AB (ref <0.031)

## 2016-02-28 LAB — LACTATE DEHYDROGENASE: LDH: 206 U/L — AB (ref 98–192)

## 2016-02-28 LAB — GLUCOSE, SEROUS FLUID: GLUCOSE FL: 77 mg/dL

## 2016-02-28 LAB — AMYLASE: AMYLASE: 20 U/L — AB (ref 28–100)

## 2016-02-28 MED ORDER — IOPAMIDOL (ISOVUE-300) INJECTION 61%
75.0000 mL | Freq: Once | INTRAVENOUS | Status: AC | PRN
  Administered 2016-02-28: 75 mL via INTRAVENOUS

## 2016-02-28 MED ORDER — VANCOMYCIN HCL IN DEXTROSE 750-5 MG/150ML-% IV SOLN
750.0000 mg | Freq: Once | INTRAVENOUS | Status: AC
  Administered 2016-02-28: 750 mg via INTRAVENOUS
  Filled 2016-02-28: qty 150

## 2016-02-28 MED ORDER — VANCOMYCIN HCL IN DEXTROSE 750-5 MG/150ML-% IV SOLN: 750.0000 mg | INTRAVENOUS | Status: DC

## 2016-02-28 MED ORDER — PREDNISONE 20 MG PO TABS: 20.0000 mg | ORAL_TABLET | Freq: Every day | ORAL | Status: DC

## 2016-02-28 MED ORDER — PREDNISONE 10 MG PO TABS: 10.0000 mg | ORAL_TABLET | Freq: Every day | ORAL | Status: DC

## 2016-02-28 MED ORDER — PIPERACILLIN-TAZOBACTAM 3.375 G IVPB: 3.3750 g | Freq: Three times a day (TID) | INTRAVENOUS | Status: DC

## 2016-02-28 MED ORDER — IPRATROPIUM-ALBUTEROL 0.5-2.5 (3) MG/3ML IN SOLN
3.0000 mL | RESPIRATORY_TRACT | Status: DC
  Administered 2016-02-28 (×2): 3 mL via RESPIRATORY_TRACT
  Filled 2016-02-28 (×2): qty 3

## 2016-02-28 MED ORDER — AMIODARONE HCL 400 MG PO TABS: 400.0000 mg | ORAL_TABLET | Freq: Two times a day (BID) | ORAL | Status: DC

## 2016-02-28 MED ORDER — VANCOMYCIN HCL IN DEXTROSE 750-5 MG/150ML-% IV SOLN
750.0000 mg | INTRAVENOUS | Status: DC
  Administered 2016-02-28: 750 mg via INTRAVENOUS
  Filled 2016-02-28 (×2): qty 150

## 2016-02-28 MED ORDER — LEVOFLOXACIN IN D5W 750 MG/150ML IV SOLN: 750.0000 mg | INTRAVENOUS | Status: DC

## 2016-02-28 MED ORDER — PIPERACILLIN-TAZOBACTAM 3.375 G IVPB
3.3750 g | Freq: Three times a day (TID) | INTRAVENOUS | Status: DC
  Administered 2016-02-28 (×2): 3.375 g via INTRAVENOUS
  Filled 2016-02-28 (×4): qty 50

## 2016-02-28 MED ORDER — FUROSEMIDE 10 MG/ML IJ SOLN: 20.0000 mg | Freq: Two times a day (BID) | INTRAMUSCULAR | Status: DC

## 2016-02-28 NOTE — Progress Notes (Signed)
Patient with right side pleural effusion per Dr Melynda Ripple.  Received order to change svn to duoneb q4 and to discontinue q2 hour xopenex.  svn and ekg given prior to patient transferring down to CT for scan. Patient tolerated well.

## 2016-02-28 NOTE — Discharge Summary (Signed)
[place] [facility] [place] NAME: Paula Krause    MR#:  [id number]  DATE OF BIRTH:  [birth year]  DATE OF ADMISSION:  02/23/2016 ADMITTING PHYSICIAN: Dustin Flock, MD  DATE OF DISCHARGE: 02/28/2016  PRIMARY CARE PHYSICIAN: Kathrine Haddock, NP    ADMISSION DIAGNOSIS:  Atrial fibrillation with rapid ventricular response (HCC) [I48.91] Sepsis, due to unspecified organism ([place]) [A41.9] Pneumonia involving right lung, unspecified part of lung [J18.9]  DISCHARGE DIAGNOSIS:  Active Problems:   Sepsis ([place])   Atrial fibrillation with rapid ventricular response (HCC)   Pneumonia   Arterial hypotension   Smoker   Centrilobular emphysema ([place])   SECONDARY DIAGNOSIS:   Past Medical History  Diagnosis Date  . Asthma   . COPD (chronic obstructive pulmonary disease) ([place])   . Depression   . Anxiety   . Esophagitis   . GERD (gastroesophageal reflux disease)   . Insomnia   . Osteoporosis   . Tobacco abuse   . Cataract     right eye  . Shortness of breath dyspnea   . Wears dentures     full upper, partial lower    HOSPITAL COURSE:    [name] history of COPD and tobacco abuse presenting with shortness of breath admitted 02/15/2016  1. Sepsis: She met septic criteria on arrival by tachycardia, tachypnea and leukocytosis. Sepsis is due to Streptococcus pneumonia bacteremia and community-acquired pneumonia.  Blood cultures are sensitive to Levaquin and therefore antibiotics were changed to Levaquin D2/12. ID consult still pending. Echocardiogram showed no source of infective endocarditis. White blood cell level slowly decreasing.  2. Acute on chronic respiratory failure: Initially this is due to COPD exacerbation/pneumonia. CT scan now shows right middle lobe consolidation and loculation with possibly developing necrosis. Continue vancomycin, Levaquin and Zosyn. She is s/p right thoracentesis.  [facility] needs to follow  up on results which are available on EPIC  3. Chronic obstructive pulmonary disease exacerbation: Continue Dulera, Levabuterol and prednisone taper.   4. Paroxysmal Atrial fibrillation remains in normal sinus rhythm cardiology input appreciated Continue amiodarone with plans to decrease dose over the next 1-2 weeks.  5. Tobacco abuse: Continue nicotine patch.  6. Elevated troponin: This is due to demand ischemia.  Continue to follow troponins. Consult cardiology if troponins continue to increase.   DISCHARGE CONDITIONS AND DIET:   Stable cardica diet  CONSULTS OBTAINED:  Treatment Team:  Minna Merritts, MD Leonel Ramsay, MD Wilhelmina Mcardle, MD  DRUG ALLERGIES:  No Known Allergies  DISCHARGE MEDICATIONS:   Current Discharge Medication List    START taking these medications   Details  amiodarone (PACERONE) 400 MG tablet Take 1 tablet (400 mg total) by mouth 2 (two) times daily. Qty: 60 tablet, Refills: 0    furosemide (LASIX) 10 MG/ML injection Inject 2 mLs (20 mg total) into the vein 2 (two) times daily. Qty: 4 mL, Refills: 0    levofloxacin (LEVAQUIN) 750 MG/150ML SOLN Inject 150 mLs (750 mg total) into the vein daily. Qty: 1000 mL, Refills: 0    piperacillin-tazobactam (ZOSYN) 3.375 GM/50ML IVPB Inject 50 mLs (3.375 g total) into the vein every 8 (eight) hours. Qty: 50 mL, Refills: 0    !! predniSONE (DELTASONE) 10 MG tablet Take 1 tablet (10 mg total) by mouth daily with breakfast. Qty: 1 tablet, Refills: 0    !! predniSONE (DELTASONE) 20 MG tablet Take 1 tablet (20 mg total) by mouth daily with breakfast. Qty: 1  tablet, Refills: 0    Vancomycin (VANCOCIN) 750 MG/150ML SOLN Inject 150 mLs (750 mg total) into the vein every 18 (eighteen) hours. Qty: 4000 mL, Refills: 0     !! - Potential duplicate medications found. Please discuss with provider.    CONTINUE these medications which have NOT CHANGED   Details  budesonide-formoterol (SYMBICORT)  160-4.5 MCG/ACT inhaler Inhale 2 puffs into the lungs 2 (two) times daily as needed (for shortness of breath).    Multiple Vitamins-Minerals (CENTRUM SILVER PO) Take 1 tablet by mouth daily.     Multiple Vitamins-Minerals (HAIR/SKIN/NAILS PO) Take 1 tablet by mouth daily.     omeprazole (PRILOSEC) 20 MG capsule Take 1 capsule (20 mg total) by mouth daily. Qty: 30 capsule, Refills: 12      STOP taking these medications     amitriptyline (ELAVIL) 50 MG tablet      montelukast (SINGULAIR) 10 MG tablet      Na Sulfate-K Sulfate-Mg Sulf (SUPREP BOWEL PREP) SOLN      polyethylene glycol (GOLYTELY) 236 g solution      zaleplon (SONATA) 5 MG capsule               Today   CHIEF COMPLAINT:  Anxious this am SOB last night CT shows loculated effusion   VITAL SIGNS:  Blood pressure 109/56, pulse 107, temperature 98.1 F (36.7 C), temperature source Oral, resp. rate 22, height _0  (1.6 m), weight 59.829 kg (131 lb 14.4 oz), SpO2 96 %.   REVIEW OF SYSTEMS:  Review of Systems  Constitutional: Negative for fever, chills and malaise/fatigue.  HENT: Negative for ear discharge, ear pain, hearing loss, nosebleeds and sore throat.   Eyes: Negative for blurred vision and pain.  Respiratory: Positive for shortness of breath. Negative for cough, hemoptysis and wheezing.   Cardiovascular: Negative for chest pain, palpitations and leg swelling.  Gastrointestinal: Negative for nausea, vomiting, abdominal pain, diarrhea and blood in stool.  Genitourinary: Negative for dysuria.  Musculoskeletal: Negative for back pain.  Neurological: Negative for dizziness, tremors, speech change, focal weakness, seizures and headaches.  Endo/Heme/Allergies: Does not bruise/bleed easily.  Psychiatric/Behavioral: Negative for depression, suicidal ideas and hallucinations.     PHYSICAL EXAMINATION:  GENERAL:  75 y.o.-year-old patient lying in the bed with no acute distress.  NECK:  Supple, no jugular  venous distention. No thyroid enlargement, no tenderness.  LUNGS:decreaed right 1/3 way up without  wheezing, rales,rhonchi  No use of accessory muscles of respiration.  CARDIOVASCULAR: S1, S2 normal. No murmurs, rubs, or gallops.  ABDOMEN: Soft, non-tender, non-distended. Bowel sounds present. No organomegaly or mass.  EXTREMITIES: No pedal edema, cyanosis, or clubbing.  PSYCHIATRIC: The patient is alert and oriented x 3.  anxious SKIN: No obvious rash, lesion, or ulcer.   DATA REVIEW:   CBC  Recent Labs Lab 02/28/16 0508  WBC 23.9*  HGB 11.4*  HCT 34.2*  PLT 341    Chemistries   Recent Labs Lab 02/24/16 0414  02/27/16 2251  NA 138  < > 138  K 3.6  < > 3.3*  CL 112*  < > 110  CO2 18*  < > 19*  GLUCOSE 187*  < > 115*  BUN 44*  < > 17  CREATININE 0.95  < > 0.64  CALCIUM 7.9*  < > 7.9*  MG 2.1  --   --   AST 33  --   --   ALT 26  --   --   ALKPHOS 110  --   --  BILITOT 1.1  --   --   < > = values in this interval not displayed.  Cardiac Enzymes  Recent Labs Lab 02/24/16 0755 02/27/16 2251 02/28/16 0508  TROPONINI <0.03 0.22* 0.46*    Microbiology Results  _0 @  RADIOLOGY:  Dg Chest 1 View  02/28/2016  CLINICAL DATA:  Status post right thoracentesis, large right pleural effusion, right lung pneumonia, shortness of breath EXAM: CHEST 1 VIEW COMPARISON:  02/27/2016 FINDINGS: Significant improvement in right effusion following thoracentesis. Small residual right effusion remains. Right lower lobe airspace consolidation persist compatible with pneumonia. No pneumothorax. Left lung remains clear. Normal heart size and vascularity. Trachea midline. No osseous abnormality. IMPRESSION: No pneumothorax following right thoracentesis. Electronically Signed   By: Jerilynn Mages.  Shick M.D.   On: 02/28/2016 11:14   Dg Chest 1 View  02/27/2016  CLINICAL DATA:  Acute onset of hypoxia.  Initial encounter. EXAM: CHEST 1 VIEW COMPARISON:  Chest radiograph performed 02/23/2016  FINDINGS: A moderate right-sided pleural effusion is noted. Bilateral central airspace opacities may reflect pneumonia or pulmonary edema. No pneumothorax is seen. The cardiomediastinal silhouette is borderline normal in size. No acute osseous abnormalities are identified. IMPRESSION: Moderate right-sided pleural effusion noted. Bilateral central airspace opacities may reflect pneumonia or pulmonary edema. Electronically Signed   By: Garald Balding M.D.   On: 02/27/2016 23:04   Ct Chest W Contrast  02/28/2016  CLINICAL DATA:  Cough. Shortness breath. Weakness for several days. Hypoxia. EXAM: CT CHEST WITH CONTRAST TECHNIQUE: Multidetector CT imaging of the chest was performed during intravenous contrast administration. CONTRAST:  68m ISOVUE-300 IOPAMIDOL (ISOVUE-300) INJECTION 61% COMPARISON:  Plain films, most recent 1 day prior. Chest CT 07/31/2008. FINDINGS: Mediastinum/Nodes: Aortic and branch vessel atherosclerosis. Mild cardiomegaly. No pericardial effusion. No central pulmonary embolism, on this non-dedicated study. Low right paratracheal adenopathy at 1.7 cm. Mild adenopathy in the azygoesophageal recess 1.5 cm. No left hilar adenopathy. Right hilum not well evaluated secondary to the extent of pulmonary opacification. Lungs/Pleura: Small simple appearing left-sided pleural effusion. A small to moderate right-sided pleural effusion has areas of loculation, including anteriorly and laterally. No abnormal pleural enhancement. Mild motion degradation. Bilateral upper lobe predominant ground-glass opacities. Dense consolidation within the right middle and less so right lower lobes. Relative hypoattenuation or hypo enhancement within the right middle lobe consolidation, including on image 36/series 2. No extra alveolar air identified. Upper abdomen: Normal imaged portions of the liver, spleen, stomach, pancreas, gallbladder, adrenal glands, kidneys. Anasarca. Musculoskeletal: No acute osseous abnormality.  IMPRESSION: 1. Right worse than left ground-glass and airspace opacities, most consistent with infection. Right larger than left pleural effusions with right-sided loculation. Areas of relative hypoattenuation or hypo enhancement within the right middle lobe consolidation could represent developing necrosis. 2. Thoracic adenopathy which is most likely reactive. Consider followup with chest CT at 3 months to confirm resolution. 3. Followup PA and lateral chest X-ray is recommended in 3-4 weeks following trial of antibiotic therapy to ensure resolution and exclude underlying malignancy. Electronically Signed   By: KAbigail MiyamotoM.D.   On: 02/28/2016 01:02   [place]oracentesis Asp Pleural Space W/img Guide  02/28/2016  INDICATION: Parapneumonic effusion, shortness of breath, right lung pneumonia EXAM: ULTRASOUND GUIDED RIGHT THORACENTESIS MEDICATIONS: None. COMPLICATIONS: 095/62/1308 02/28/2016 PROCEDURE: An ultrasound guided thoracentesis was thoroughly discussed with the patient and questions answered. The benefits, risks, alternatives and complications were also discussed. The patient understands and wishes to proceed with the procedure. Written consent was obtained. Ultrasound was performed to localize  and mark an adequate pocket of fluid in the right chest. The area was then prepped and draped in the normal sterile fashion. 1% Lidocaine was used for local anesthesia. Under ultrasound guidance a 6 Fr Safe-T-Centesis catheter was introduced. Thoracentesis was performed. The catheter was removed and a dressing applied. FINDINGS: A total of approximately 850 cc of amber pleural fluid fluid was removed. Samples were sent to the laboratory as requested by the clinical team. IMPRESSION: Successful ultrasound guided right thoracentesis yielding 850 cc of pleural fluid. Electronically Signed   By: Jerilynn Mages.  Shick M.D.   On: 02/28/2016 11:12      Management plans discussed with the patient and she is in agreement. Stable for  discharge [facility] per family request  Patient should follow up with [facility]  CODE STATUS:     Code Status Orders        Start     Ordered   02/23/16 1851  Full code   Continuous     02/23/16 1851    Code Status History    Date Active Date Inactive Code Status Order ID Comments User Context   This patient has a current code status but no historical code status.    Advance Directive Documentation        Most Recent Value   Type of Advance Directive  Healthcare Power of Attorney   Pre-existing out of facility DNR order (yellow form or pink MOST form)     "MOST" Form in Place?        TOTAL TIME TAKING CARE OF THIS PATIENT: 35 minutes.    Note: This dictation was prepared with Dragon dictation along with smaller phrase technology. Any transcriptional errors that result from this process are unintentional.  Sharone Picchi M.D on 02/28/2016 at 11:43 AM  Between 7am to 6pm - Pager - [phone] After 6pm go to [email or web address] - password [email or web address] [facility] [facility]  Office  [phone]  CC: Primary care physician; Kathrine Haddock, NP

## 2016-02-28 NOTE — Progress Notes (Signed)
Pt to be transferred to [facility] via [facility].iv sal locks intact. Tele removed. Packet given to [facility]. Report called to tiffany at [facility].Marland Kitchen Pt voided prior to discharge. Family to accompany.

## 2016-02-28 NOTE — Progress Notes (Signed)
Notified MD of critical troponin 0.22. No new orders at this time. Nursing staff will continue to monitor. Earleen Reaper, RN

## 2016-02-28 NOTE — Progress Notes (Signed)
Pt returned from thorocentesis. Awake alert oriented. C/o mild pain at insertion site. Plans to be transferred to [facility] this afternoon. Vss. Family at bedside.

## 2016-02-28 NOTE — Progress Notes (Signed)
LCSW consulted with 2nd floor nurse and was informed that patient is transferring to [facility],  LCSW went in to see patient and family. Patient was reluctant to discuss her stay at the hospital and denied her granddaughter who is a nurse to speak about her situation at the hospital. LCSW discussed that there is a patient relations and encouraged them to fill out the respective surveys. No further needs at this time Paula Krause

## 2016-02-28 NOTE — Progress Notes (Signed)
[place] [facility] [place] NAME: Paula Krause    MR#:  [id number]  DATE OF BIRTH:  11-Nov-1941  SUBJECTIVE:  Patient had an episode of shortness of breath yesterday. She was given 1 dose of Lasix. Then again last night patient was still feeling short of breath. CT chest shows loculated effusion. Family wanted transfer to [facility]. [facility] today says patient is on wait list  REVIEW OF SYSTEMS:  CONSTITUTIONAL: No fever, positive fatigue and generalized weakness.  EYES: No blurred or double vision.  EARS, NOSE, AND THROAT: No tinnitus or ear pain.  RESPIRATORY: Positive cough, shortness of breath, no wheezing No hemoptysis.  CARDIOVASCULAR: No chest pain, orthopnea, edema.  GASTROINTESTINAL: No nausea, vomiting, diarrhea or abdominal pain.  GENITOURINARY: No dysuria, hematuria.  ENDOCRINE: No polyuria, nocturia,  HEMATOLOGY: No anemia, easy bruising or bleeding SKIN: No rash or lesion. MUSCULOSKELETAL: No joint pain or arthritis.   NEUROLOGIC: No tingling, numbness, weakness.  PSYCHIATRY: Anxious  DRUG ALLERGIES:  No Known Allergies  VITALS:  Blood pressure 101/61, pulse 103, temperature 98.1 F (36.7 C), temperature source Oral, resp. rate 20, height _0  (1.6 m), weight 59.829 kg (131 lb 14.4 oz), SpO2 96 %.  PHYSICAL EXAMINATION:  VITAL SIGNS: Filed Vitals:   02/27/16 1951 02/28/16 0446  BP: 144/85 101/61  Pulse: 106 103  Temp: 98.5 F (36.9 C) 98.1 F (36.7 C)  Resp: 20 20   GENERAL:75 y.o.female currently in no acute distress.  HEAD: Normocephalic, atraumatic.  EYES: Pupils equal, round, reactive to light. Extraocular muscles intact. No scleral icterus.  MOUTH: Moist mucosal membrane. POOR Dentition No abscess noted.  EAR, NOSE, THROAT: Clear without exudates. No external lesions.  NECK: Supple. No thyromegaly. No nodules. No JVD.  PULMONARY:decreased one third right base no wheezing or rales  No use of accessory muscles,    CHEST: Nontender to palpation.  CARDIOVASCULAR: S1 and S2. Regular rate and rhythm No murmurs, rubs, or gallops. No edema. Pedal pulses 2+ bilaterally.  GASTROINTESTINAL: Soft, nontender, nondistended. No masses. Positive bowel sounds. No hepatosplenomegaly.  MUSCULOSKELETAL: No swelling, clubbing, or edema. Range of motion full in all extremities.  NEUROLOGIC: Cranial nerves II through XII are intact. No gross focal neurological deficits. Sensation intact. Reflexes intact.  SKIN: No ulceration, lesions, rashes, or cyanosis. Skin warm and dry. Turgor intact.  PSYCHIATRIC:  The patient is awake, alert and oriented x 3.  anxious     LABORATORY PANEL:   CBC  Recent Labs Lab 02/28/16 0508  WBC 23.9*  HGB 11.4*  HCT 34.2*  PLT 341   ------------------------------------------------------------------------------------------------------------------  Chemistries   Recent Labs Lab 02/24/16 0414  02/27/16 2251  NA 138  < > 138  K 3.6  < > 3.3*  CL 112*  < > 110  CO2 18*  < > 19*  GLUCOSE 187*  < > 115*  BUN 44*  < > 17  CREATININE 0.95  < > 0.64  CALCIUM 7.9*  < > 7.9*  MG 2.1  --   --   AST 33  --   --   ALT 26  --   --   ALKPHOS 110  --   --   BILITOT 1.1  --   --   < > = values in this interval not displayed. ------------------------------------------------------------------------------------------------------------------  Cardiac Enzymes  Recent Labs Lab 02/28/16 0508  TROPONINI 0.46*   ------------------------------------------------------------------------------------------------------------------  RADIOLOGY:  Dg Chest 1 View  02/27/2016  CLINICAL DATA:  Acute onset of hypoxia.  Initial encounter. EXAM: CHEST 1 VIEW COMPARISON:  Chest radiograph performed 02/23/2016 FINDINGS: A moderate right-sided pleural effusion is noted. Bilateral central airspace opacities may reflect pneumonia or pulmonary edema. No pneumothorax is seen. The cardiomediastinal silhouette  is borderline normal in size. No acute osseous abnormalities are identified. IMPRESSION: Moderate right-sided pleural effusion noted. Bilateral central airspace opacities may reflect pneumonia or pulmonary edema. Electronically Signed   By: Garald Balding M.D.   On: 02/27/2016 23:04   Ct Chest W Contrast  02/28/2016  CLINICAL DATA:  Cough. Shortness breath. Weakness for several days. Hypoxia. EXAM: CT CHEST WITH CONTRAST TECHNIQUE: Multidetector CT imaging of the chest was performed during intravenous contrast administration. CONTRAST:  54m ISOVUE-300 IOPAMIDOL (ISOVUE-300) INJECTION 61% COMPARISON:  Plain films, most recent 1 day prior. Chest CT 07/31/2008. FINDINGS: Mediastinum/Nodes: Aortic and branch vessel atherosclerosis. Mild cardiomegaly. No pericardial effusion. No central pulmonary embolism, on this non-dedicated study. Low right paratracheal adenopathy at 1.7 cm. Mild adenopathy in the azygoesophageal recess 1.5 cm. No left hilar adenopathy. Right hilum not well evaluated secondary to the extent of pulmonary opacification. Lungs/Pleura: Small simple appearing left-sided pleural effusion. A small to moderate right-sided pleural effusion has areas of loculation, including anteriorly and laterally. No abnormal pleural enhancement. Mild motion degradation. Bilateral upper lobe predominant ground-glass opacities. Dense consolidation within the right middle and less so right lower lobes. Relative hypoattenuation or hypo enhancement within the right middle lobe consolidation, including on image 36/series 2. No extra alveolar air identified. Upper abdomen: Normal imaged portions of the liver, spleen, stomach, pancreas, gallbladder, adrenal glands, kidneys. Anasarca. Musculoskeletal: No acute osseous abnormality. IMPRESSION: 1. Right worse than left ground-glass and airspace opacities, most consistent with infection. Right larger than left pleural effusions with right-sided loculation. Areas of relative  hypoattenuation or hypo enhancement within the right middle lobe consolidation could represent developing necrosis. 2. Thoracic adenopathy which is most likely reactive. Consider followup with chest CT at 3 months to confirm resolution. 3. Followup PA and lateral chest X-ray is recommended in 3-4 weeks following trial of antibiotic therapy to ensure resolution and exclude underlying malignancy. Electronically Signed   By: KAbigail MiyamotoM.D.   On: 02/28/2016 01:02    EKG:     ASSESSMENT AND PLAN:   76year old Caucasian female history of COPD and tobacco abuse presenting with shortness of breath admitted 02/15/2016  1. Sepsis: She met septic criteria on arrival by tachycardia, tachypnea and leukocytosis. Sepsis is due to Streptococcus pneumonia bacteremia and community-acquired pneumonia.  Blood cultures are sensitive to Levaquin and therefore antibiotics have been changed to Levaquin. ID consult pending. Echocardiogram showed no source of infective endocarditis. White blood cell level slowly decreasing.  2. Acute on chronic respiratory failure: Initially this is due to COPD exacerbation/pneumonia. CT scan now shows right middle lobe consolidation and loculation with possibly developing necrosis. Continue vancomycin, Levaquin and Zosyn. Consult pulmonary. I spoke with interventional radiologist who will perform thoracentesis today. Thoracentesis labs ordered.  3. Chronic obstructive pulmonary disease exacerbation: Continue Dulera, Levabuterol and prednisone taper.   4. Paroxysmal Atrial fibrillation remains in normal sinus rhythm cardiology input appreciated Continue amiodarone with plans to decrease dose over the next 1-2 weeks.  5. Tobacco abuse: Continue nicotine patch.   [facility]. [facility]   Management plans discussed with the patient and she is  in agreement. Discussed with family at  bedside Discussed with interventional radiologist. CODE STATUS: Full  TOTAL TIME TAKING CARE OF THIS PATIENT: 34 minutes.   POSSIBLE D/C IN 4-5 days, DEPENDING ON CLINICAL CONDITION. Physical therapy is recommending skilled nursing facility. Clinical social worker consult place.  Kier Smead M.D on 02/28/2016 at 9:50 AM  Between 7am to 6pm - Pager - [phone]  After 6pm: House Pager: - [phone]  [facility]  Office  [phone]  CC: Primary care physician; Kathrine Haddock, NP

## 2016-02-28 NOTE — Progress Notes (Signed)
Pharmacy Antibiotic Note  Paula Krause is a 75 y.o. female admitted on 02/23/2016 with pneumonia/concern for empyema.  Pharmacy has been consulted for Zosyn and vancomycin dosing (in addition to levofloxacin).  Plan: 1. Levofloxacin 750 mg IV Q24H 2. Zosyn 3.375 gm IV Q8H EI 3. Vancomycin 750 mg IV Q18H with stacked dosing, second dose approximately 6 hours after first, predicted trough 15 mcg/mL. Pharmacy will continue to follow and adjust as needed to maintain trough 15 to 20 mcg/mL.   Vd 38.5 L, Ke 0.047 hr-1, T1/2 14.8 hr  Height: 5\' 3"  (160 cm) Weight: 131 lb 14.4 oz (59.829 kg) IBW/kg (Calculated) : 52.4  Temp (24hrs), Avg:98.2 F (36.8 C), Min:98 F (36.7 C), Max:98.5 F (36.9 C)   Recent Labs Lab 02/23/16 1652 02/24/16 0414 02/25/16 0510 02/27/16 0526 02/27/16 2251  WBC 24.6*  --  23.9* 27.4*  --   CREATININE 1.41* 0.95 0.73 0.58 0.64    Estimated Creatinine Clearance: 51.8 mL/min (by C-G formula based on Cr of 0.64).    No Known Allergies  Thank you for allowing pharmacy to be a part of this patient's care.  Laural Benes, Pharm.D., BCPS Clinical Pharmacist 02/28/2016 12:46 AM

## 2016-02-28 NOTE — Progress Notes (Deleted)
[place] [facility] [place] NAME: Paula Krause    MR#:  [id number]  DATE OF BIRTH:  11-Nov-1941  SUBJECTIVE:  Patient had an episode of shortness of breath yesterday. She was given 1 dose of Lasix. Then again last night patient was still feeling short of breath. CT chest shows loculated effusion. Family wanted transfer to [facility]. [facility]  REVIEW OF SYSTEMS:  CONSTITUTIONAL: No fever, positive fatigue and generalized weakness.  EYES: No blurred or double vision.  EARS, NOSE, AND THROAT: No tinnitus or ear pain.  RESPIRATORY: Positive cough, shortness of breath, no wheezing No hemoptysis.  CARDIOVASCULAR: No chest pain, orthopnea, edema.  GASTROINTESTINAL: No nausea, vomiting, diarrhea or abdominal pain.  GENITOURINARY: No dysuria, hematuria.  ENDOCRINE: No polyuria, nocturia,  HEMATOLOGY: No anemia, easy bruising or bleeding SKIN: No rash or lesion. MUSCULOSKELETAL: No joint pain or arthritis.   NEUROLOGIC: No tingling, numbness, weakness.  PSYCHIATRY: Anxious  DRUG ALLERGIES:  No Known Allergies  VITALS:  Blood pressure 101/61, pulse 103, temperature 98.1 F (36.7 C), temperature source Oral, resp. rate 20, height _0  (1.6 m), weight 59.829 kg (131 lb 14.4 oz), SpO2 96 %.  PHYSICAL EXAMINATION:  VITAL SIGNS: Filed Vitals:   02/27/16 1951 02/28/16 0446  BP: 144/85 101/61  Pulse: 106 103  Temp: 98.5 F (36.9 C) 98.1 F (36.7 C)  Resp: 20 20   GENERAL:75 y.o.female currently in no acute distress.  HEAD: Normocephalic, atraumatic.  EYES: Pupils equal, round, reactive to light. Extraocular muscles intact. No scleral icterus.  MOUTH: Moist mucosal membrane. POOR Dentition No abscess noted.  EAR, NOSE, THROAT: Clear without exudates. No external lesions.  NECK: Supple. No thyromegaly. No nodules. No JVD.  PULMONARY:decreased one third right base no wheezing or rales  No use of accessory muscles,    CHEST: Nontender to palpation.  CARDIOVASCULAR: S1 and S2. Regular rate and rhythm No murmurs, rubs, or gallops. No edema. Pedal pulses 2+ bilaterally.  GASTROINTESTINAL: Soft, nontender, nondistended. No masses. Positive bowel sounds. No hepatosplenomegaly.  MUSCULOSKELETAL: No swelling, clubbing, or edema. Range of motion full in all extremities.  NEUROLOGIC: Cranial nerves II through XII are intact. No gross focal neurological deficits. Sensation intact. Reflexes intact.  SKIN: No ulceration, lesions, rashes, or cyanosis. Skin warm and dry. Turgor intact.  PSYCHIATRIC:  The patient is awake, alert and oriented x 3.  anxious     LABORATORY PANEL:   CBC  Recent Labs Lab 02/28/16 0508  WBC 23.9*  HGB 11.4*  HCT 34.2*  PLT 341   ------------------------------------------------------------------------------------------------------------------  Chemistries   Recent Labs Lab 02/24/16 0414  02/27/16 2251  NA 138  < > 138  K 3.6  < > 3.3*  CL 112*  < > 110  CO2 18*  < > 19*  GLUCOSE 187*  < > 115*  BUN 44*  < > 17  CREATININE 0.95  < > 0.64  CALCIUM 7.9*  < > 7.9*  MG 2.1  --   --   AST 33  --   --   ALT 26  --   --   ALKPHOS 110  --   --   BILITOT 1.1  --   --   < > = values in this interval not displayed. ------------------------------------------------------------------------------------------------------------------  Cardiac Enzymes  Recent Labs Lab 02/28/16 0508  TROPONINI 0.46*   ------------------------------------------------------------------------------------------------------------------  RADIOLOGY:  Dg Chest 1 View  02/27/2016  CLINICAL DATA:  Acute onset of hypoxia.  Initial encounter. EXAM: CHEST 1 VIEW COMPARISON:  Chest radiograph performed 02/23/2016 FINDINGS: A moderate right-sided pleural effusion is noted. Bilateral central airspace opacities may reflect pneumonia or pulmonary edema. No pneumothorax is seen. The cardiomediastinal silhouette  is borderline normal in size. No acute osseous abnormalities are identified. IMPRESSION: Moderate right-sided pleural effusion noted. Bilateral central airspace opacities may reflect pneumonia or pulmonary edema. Electronically Signed   By: Garald Balding M.D.   On: 02/27/2016 23:04   Ct Chest W Contrast  02/28/2016  CLINICAL DATA:  Cough. Shortness breath. Weakness for several days. Hypoxia. EXAM: CT CHEST WITH CONTRAST TECHNIQUE: Multidetector CT imaging of the chest was performed during intravenous contrast administration. CONTRAST:  15m ISOVUE-300 IOPAMIDOL (ISOVUE-300) INJECTION 61% COMPARISON:  Plain films, most recent 1 day prior. Chest CT 07/31/2008. FINDINGS: Mediastinum/Nodes: Aortic and branch vessel atherosclerosis. Mild cardiomegaly. No pericardial effusion. No central pulmonary embolism, on this non-dedicated study. Low right paratracheal adenopathy at 1.7 cm. Mild adenopathy in the azygoesophageal recess 1.5 cm. No left hilar adenopathy. Right hilum not well evaluated secondary to the extent of pulmonary opacification. Lungs/Pleura: Small simple appearing left-sided pleural effusion. A small to moderate right-sided pleural effusion has areas of loculation, including anteriorly and laterally. No abnormal pleural enhancement. Mild motion degradation. Bilateral upper lobe predominant ground-glass opacities. Dense consolidation within the right middle and less so right lower lobes. Relative hypoattenuation or hypo enhancement within the right middle lobe consolidation, including on image 36/series 2. No extra alveolar air identified. Upper abdomen: Normal imaged portions of the liver, spleen, stomach, pancreas, gallbladder, adrenal glands, kidneys. Anasarca. Musculoskeletal: No acute osseous abnormality. IMPRESSION: 1. Right worse than left ground-glass and airspace opacities, most consistent with infection. Right larger than left pleural effusions with right-sided loculation. Areas of relative  hypoattenuation or hypo enhancement within the right middle lobe consolidation could represent developing necrosis. 2. Thoracic adenopathy which is most likely reactive. Consider followup with chest CT at 3 months to confirm resolution. 3. Followup PA and lateral chest X-ray is recommended in 3-4 weeks following trial of antibiotic therapy to ensure resolution and exclude underlying malignancy. Electronically Signed   By: KAbigail MiyamotoM.D.   On: 02/28/2016 01:02    EKG:     ASSESSMENT AND PLAN:   75year old Caucasian female history of COPD and tobacco abuse presenting with shortness of breath admitted 02/15/2016  1. Sepsis: She met septic criteria on arrival by tachycardia, tachypnea and leukocytosis. Sepsis is due to Streptococcus pneumonia bacteremia and community-acquired pneumonia.  Blood cultures are sensitive to Levaquin and therefore antibiotics have been changed to Levaquin. ID consult pending. Echocardiogram showed no source of infective endocarditis. White blood cell level slowly decreasing.  2. Acute on chronic respiratory failure: Initially this is due to COPD exacerbation/pneumonia. CT scan now shows right middle lobe consolidation and loculation with possibly developing necrosis. Continue vancomycin, Levaquin and Zosyn. Consult pulmonary. I spoke with interventional radiologist who will perform thoracentesis today. Thoracentesis labs ordered.  3. Chronic obstructive pulmonary disease exacerbation: Continue Dulera, Levabuterol and prednisone taper.   4. Paroxysmal Atrial fibrillation remains in normal sinus rhythm cardiology input appreciated Continue amiodarone with plans to decrease dose over the next 1-2 weeks.  5. Tobacco abuse: Continue nicotine patch.  6. Elevated troponin: This is due to demand ischemia.  Continue to follow troponins. We'll consult cardiology if troponins continue to increase. [facility] Patient is on wait list which could  be days as per  [facility] of transfer   Management plans discussed with the patient and she is  in agreement. Discussed with family at bedside Discussed with interventional radiologist. CODE STATUS: Full  TOTAL TIME TAKING CARE OF THIS PATIENT: 34 minutes.   POSSIBLE D/C IN 4-5 days, DEPENDING ON CLINICAL CONDITION. Physical therapy is recommending skilled nursing facility. Clinical social worker consult place.  Cassie Shedlock M.D on 02/28/2016 at 9:54 AM  Between 7am to 6pm - Pager - [phone]  After 6pm: House Pager: - [phone]  [facility]  Office  [phone]  CC: Primary care physician; Kathrine Haddock, NP

## 2016-02-29 LAB — MISC LABCORP TEST (SEND OUT): LABCORP TEST CODE: 19588

## 2016-03-01 LAB — CYTOLOGY - NON PAP

## 2016-03-01 LAB — COMP PANEL: LEUKEMIA/LYMPHOMA

## 2016-03-04 LAB — CHOLESTEROL, BODY FLUID: CHOL FL: 34 mg/dL

## 2016-03-04 LAB — TRIGLYCERIDES, BODY FLUIDS: TRIGLYCERIDES FL: 29 mg/dL

## 2016-03-05 LAB — CULTURE, BLOOD (ROUTINE X 2)

## 2016-03-06 LAB — BODY FLUID CULTURE: Culture: NO GROWTH

## 2016-03-08 LAB — PH, BODY FLUID: pH, Body Fluid: 7.7

## 2016-03-24 ENCOUNTER — Institutional Professional Consult (permissible substitution): Payer: Self-pay | Admitting: Internal Medicine

## 2016-03-28 ENCOUNTER — Other Ambulatory Visit: Payer: Self-pay | Admitting: *Deleted

## 2016-03-28 DIAGNOSIS — J441 Chronic obstructive pulmonary disease with (acute) exacerbation: Secondary | ICD-10-CM

## 2016-03-28 NOTE — Patient Outreach (Addendum)
[facility] [facility]) Care Management  03/28/2016  Paula Krause 11-20-1941 [id number]:[id number]  Subjective: Telephone call to patient's home number, spoke with patient, and HIPAA verified.  Patient gave [facility] verbal authorization to speak with grand-daughters (Brandy Faucette and Becky [place]a)  regarding her healthcare needs as needed.  Discussed [facility] Care Management services and patient in agreement to receive services. Patient states she was recently discharged from [facility] in [place] on 03/22/16.   States she was transferred to [facility] from [facility] on 02/28/16.  Patient in agreement to referral to [place] for transition of care due to recent hospitalization, care coordination, and assistance with obtaining shower chair with a back.    Patient states she is currently [facility] [phone] or [phone]).   States services include RN, OT, and PT.  States the home health nurse was suppose to come out today to draw blood and has not shown up.   RNCM advised patient to follow up with home health agency regarding today's RN visit.   Patient voices understanding and states she will call the home health agency to advised that nurse has not come out.   Patient states she has a rolling walker that she obtained through [facility] during inpatient stay.  Patient states she would like assistance with obtaining a shower chair with a back.   RNCM advised home health OT and [place] would assist with patient obtaining shower chair.   RNCM advised shower chair is usually not covered by insurance and patient states she does not have the [employer] OTC benefit catalog.   Patient states she currently has a friend that is staying with her for a few weeks to assist with home management.   States her grand-daughter Debby Freiberg) is a Marine scientist and is helping her with her IV medication.   Patient states  she has COPD and does not need any disease education at this time.   Patient in agreement to continue to receive [place] Management services. Patient does not want to write down [place] Management contact information at this time, states she will wait for [facility] RNCM to contact her for next follow up.    Objective: Per chart review: Patient hospitalized at [facility] 02/23/16 - 02/28/16 for Atrial fibrillation with rapid ventricular response, Sepsis, due to unspecified organism ,  And Pneumonia involving right lung, unspecified part of lung.     Assessment:  Received [facility] Care Management referral on 03/28/16.   Referral source: [employer].   Reason for referral: Recent hospitalization.  Patient has 4 weeks of IV antibiotics, multiple new medications, recent thoracentesis and chest tube incisions.  She has a past medical history of: depression, aneurysm, COPD, asthma, GERD, insomnia, smoker, osteoporosis, removal of cataracts. Wears dentures (upper and partial  Lower).  Granddaughter is RN according to patient.  She lives alone, has someone staying with her for 2 weeks, then she will be alone in the home again.   She could benefit from ongoing CM / DM services.   Dependent on granddaughter for transportation and helping with IV antibiotics.  She would like a shower chair.  Address correct in EPIC not in [place].    Services requested: Telephonic and Pharmacy.   Patient will receive [place] Management services and no further telephonic RNCM needs at this time.   Plan:  RNCM will refer patient to [facility]  RNCM for  transition of care due to recent hospitalization, care coordination, and assistance with obtaining shower chair with a back.   RNCM will also ask [facility] RNCM to evaluate patient for pharmacy referral based on [facility] referral and verify if patient eligible for [employer] OTC benefit.   Mallory Enriques H. Annia Friendly, BSN, [place] [facility] Telephonic CM Phone:  [phone] Fax: [phone]

## 2016-03-30 ENCOUNTER — Inpatient Hospital Stay: Payer: Commercial Managed Care - HMO | Admitting: Unknown Physician Specialty

## 2016-03-30 ENCOUNTER — Telehealth: Payer: Self-pay | Admitting: Unknown Physician Specialty

## 2016-03-30 NOTE — Telephone Encounter (Signed)
Crystal from [employer] called needs a call back from [place] or [place] concerning follow up needed after 23 day hospital stay at [facility] (02/28/16-03/22/16), pt was at [facility] (02/23/16-02/28/16). Please call her ASAP. Thanks.

## 2016-03-30 NOTE — Telephone Encounter (Signed)
Called and left Paula Krause a voicemail asking for her to please return my call.

## 2016-03-31 NOTE — Telephone Encounter (Signed)
Called and left Crystal a voicemail asking for her to please return my call.

## 2016-04-01 ENCOUNTER — Telehealth: Payer: Self-pay

## 2016-04-01 NOTE — Telephone Encounter (Signed)
Tried to call patient about hospital f/u but there was no answer and no voicemail. Will try to call again Monday.

## 2016-04-01 NOTE — Telephone Encounter (Signed)
Called and left crystal at [facility] a voicemail letting her know that I was not able to reach the patient and that I was going to try again on Monday. I also asked if she could fax [place] a copy of the patient's [facility] discharge summary. I gave her out fax number and I let her know that I would keep her updated as to when the patient's follow up is going to be.

## 2016-04-01 NOTE — Telephone Encounter (Signed)
Called and left Crystal a voicemail asking for her to please return my call.

## 2016-04-01 NOTE — Telephone Encounter (Signed)
Crystal called from [facility] asking about patient's hospital f/u. She stated that the patient knew nothing about the appointment and wants me to call and reschedule the appointment. Crystal stated that there are 3 things that the patient needs done listed on the [facility] discharge papers. She also stated that she has gotten [facility] involved with the patient. I will try to call the patient to see if we can get her hospital f/u with [place] rescheduled.

## 2016-04-04 ENCOUNTER — Other Ambulatory Visit: Payer: Self-pay | Admitting: *Deleted

## 2016-04-04 NOTE — Telephone Encounter (Signed)
Tried to call patient to rescheduled hospital f/u but there was no answer and no voicemail. Will try to call again later.

## 2016-04-04 NOTE — Telephone Encounter (Signed)
Called and left Crystal at [employer] a voicemail letting her know that I just tried to contact the patient again and she did not answer.

## 2016-04-04 NOTE — Patient Outreach (Signed)
Phone call to pt successful, f/u on referral received from [facility] telephonic RN CM.  Spoke with pt, HIPPA verified.   As discussed with pt, plan to do a home visit 5/10.    Zara Chess.   [place] [facility]  [phone]

## 2016-04-05 NOTE — Telephone Encounter (Signed)
Tried to call patient to reschedule hospital f/u. No answer and no voicemail available.

## 2016-04-06 ENCOUNTER — Encounter: Payer: Self-pay | Admitting: *Deleted

## 2016-04-06 ENCOUNTER — Other Ambulatory Visit: Payer: Self-pay | Admitting: *Deleted

## 2016-04-06 NOTE — Telephone Encounter (Signed)
Tried to call patient to reschedule hospital f/u. No answer and no voicemail. Will try to call again later.

## 2016-04-07 ENCOUNTER — Encounter: Payer: Self-pay | Admitting: *Deleted

## 2016-04-07 NOTE — Telephone Encounter (Signed)
Called and spoke to patient. We rescheduled her hospital f/u for 04/20/16.

## 2016-04-07 NOTE — Patient Outreach (Addendum)
[facility]) Care Management   Home visit done 04/06/16  Paula Krause [birth year] [id number]  Paula Krause is an 75 y.o. female  Subjective:  Pt reports she was in the hospital for a total of 30 days, had pneumonia, was transferred from [facility] to [place].  Pt reports she her last dose of IV antibiotic yesterday,  [place] RN suppose to come today to take PICC line out.   Pt reports she is suppose to f/u with Paula Haddock NP next week but is planning to get a new MD in [place].  Pt reports to f/u with Lung MD in [place] in May.   Pt reports she has oxygen available to use if needed.     Objective:   Filed Vitals:   04/06/16 1512  BP: 108/58  Pulse: 94  Resp: 16    ROS  Physical Exam  Constitutional: She is oriented to person, place, and time. She appears well-developed and well-nourished.  Cardiovascular: Normal rate, regular rhythm and normal heart sounds.   Respiratory: Effort normal and breath sounds normal.  GI: Soft. Bowel sounds are normal.  Musculoskeletal: Normal range of motion.  Neurological: She is alert and oriented to person, place, and time.  Skin: Skin is warm and dry.  Psychiatric: She has a normal mood and affect. Her behavior is normal. Judgment and thought content normal.    Encounter Medications:  Reviewed with pt  Outpatient Encounter Prescriptions as of 04/06/2016  Medication Sig  . amoxicillin-clavulanate (AUGMENTIN) 875-125 MG tablet Take 1 tablet by mouth 2 (two) times daily.  Marland Kitchen aspirin 81 MG tablet Take 81 mg by mouth daily.  . budesonide-formoterol (SYMBICORT) 160-4.5 MCG/ACT inhaler Inhale 2 puffs into the lungs 2 (two) times daily as needed (for shortness of breath).  . ferrous sulfate 325 (65 FE) MG tablet Take 325 mg by mouth daily with breakfast. Three times a day  . furosemide (LASIX) 20 MG tablet Take 20 mg by mouth.  . mirtazapine (REMERON) 30 MG tablet Take 30 mg by mouth at bedtime.  . montelukast (SINGULAIR) 10 MG  tablet Take 10 mg by mouth at bedtime.  . Multiple Vitamins-Minerals (CENTRUM SILVER PO) Take 1 tablet by mouth daily.   Marland Kitchen omeprazole (PRILOSEC) 20 MG capsule Take 1 capsule (20 mg total) by mouth daily.  . traZODone (DESYREL) 50 MG tablet Take 50 mg by mouth at bedtime.  Marland Kitchen amiodarone (PACERONE) 400 MG tablet Take 1 tablet (400 mg total) by mouth 2 (two) times daily. (Patient not taking: Reported on 04/06/2016)  . furosemide (LASIX) 10 MG/ML injection Inject 2 mLs (20 mg total) into the vein 2 (two) times daily. (Patient not taking: Reported on 04/06/2016)  . levofloxacin (LEVAQUIN) 750 MG/150ML SOLN Inject 150 mLs (750 mg total) into the vein daily. (Patient not taking: Reported on 04/06/2016)  . Multiple Vitamins-Minerals (HAIR/SKIN/NAILS PO) Take 1 tablet by mouth daily. Reported on 04/06/2016  . piperacillin-tazobactam (ZOSYN) 3.375 GM/50ML IVPB Inject 50 mLs (3.375 g total) into the vein every 8 (eight) hours. (Patient not taking: Reported on 04/06/2016)  . predniSONE (DELTASONE) 10 MG tablet Take 1 tablet (10 mg total) by mouth daily with breakfast. (Patient not taking: Reported on 04/06/2016)  . predniSONE (DELTASONE) 20 MG tablet Take 1 tablet (20 mg total) by mouth daily with breakfast. (Patient not taking: Reported on 04/06/2016)  . Vancomycin (VANCOCIN) 750 MG/150ML SOLN Inject 150 mLs (750 mg total) into the vein every 18 (eighteen) hours. (Patient not  taking: Reported on 04/06/2016)   No facility-administered encounter medications on file as of 04/06/2016.    Functional Status:   In your present state of health, do you have any difficulty performing the following activities: 04/06/2016 02/24/2016  Hearing? N N  Vision? N N  Difficulty concentrating or making decisions? N N  Walking or climbing stairs? N N  Dressing or bathing? N N  Doing errands, shopping? N N  Preparing Food and eating ? N -  Using the Toilet? N -  In the past six months, have you accidently leaked urine? N -  Do you  have problems with loss of bowel control? Y -  Managing your Medications? N -  Managing your Finances? N -  Housekeeping or managing your Housekeeping? N -    Fall/Depression Screening:    PHQ 2/9 Scores 04/06/2016 08/24/2015  PHQ - 2 Score 1 2  PHQ- 9 Score - 10    Assessment:  Pleasant 75 year old woman, resides alone in apartment.   Granddaughter a Marine scientist, very Supportive, assists as needed.                           COPD- recent hospital discharge for PNA.  Lungs clear.  [place] RN came during home visit,                              Removed PICC line (used for IV antibiotic), plan to discharge.  Pt  now taking oral antibiotic                              Augmentin - 14 day coarse.                              Education needed on maintenance inhaler being used as a rescue inhaler.  Has O2                              Available if needed. Per pt, remains smoke free- wearing Nicoderm patch, changes daily  Plan:   Pt to f/u with Paula Haddock NP next week, as discussed to ask for rx for rescue inhaler.             Pt to review COPD action plan provided today.               Plan to continue to provide community nurse case management services- next home visit 6/5.               Plan to send Paula Haddock NP 5/10 home visit encounter, barrier letter by in basket.    THN CM Care Plan Problem One        Most Recent Value   Care Plan Problem One  Risk for readmission related to - recent hospitalization for PNA    Role Documenting the Problem One  Care Management [place] for Problem One  Active   THN Long Term Goal (31-90 days)  Pt would not readmit in the next 31 days    THN Long Term Goal Start Date  04/06/16   Interventions for Problem One Long Term Goal  Transition of care program initiated- home viisit completed.    THN CM Short  Term Goal #1 (0-30 days)  Pt would have better understanding of COPD zones in the next 30 days    THN CM Short Term Goal #1 Start Date  04/06/16    Interventions for Short Term Goal #1  Provided pt with COPD zones magnet, reviewed the different zones    THN CM Short Term Goal #2 (0-30 days)  Pt would request rx for rescue inhaler from NP at upcoming visit , get rx filled in the next 14 days    THN CM Short Term Goal #2 Start Date  04/06/16   Interventions for Short Term Goal #2  Educated pt on use of Symbicort - maintenance medication, need rescue inhaler when sob, wheezing.      Zara Chess.   [facility]  [phone]   .Addendum- plan to also  follow pt for transition of care- weekly calls (3 remaining).

## 2016-04-13 ENCOUNTER — Other Ambulatory Visit: Payer: Self-pay | Admitting: *Deleted

## 2016-04-13 NOTE — Patient Outreach (Signed)
Attempt made to contact pt as part of transition of care.   Unable to leave a voice message as message on phone- person not available, try again later.   Plan to try again.    Zara Chess.   [place] [facility]  [phone]

## 2016-04-18 ENCOUNTER — Other Ambulatory Visit: Payer: Self-pay | Admitting: *Deleted

## 2016-04-18 ENCOUNTER — Ambulatory Visit: Payer: Self-pay | Admitting: *Deleted

## 2016-04-18 NOTE — Patient Outreach (Signed)
Second attempt made to contact pt as part of transition of care.   Unable to leave voice message as message on phone - wireless customer not available, please try again later.  Plan to try again.   Zara Chess.   [facility]  [phone]

## 2016-04-20 ENCOUNTER — Inpatient Hospital Stay: Payer: Self-pay | Admitting: Unknown Physician Specialty

## 2016-04-26 ENCOUNTER — Other Ambulatory Visit: Payer: Self-pay | Admitting: *Deleted

## 2016-04-26 ENCOUNTER — Ambulatory Visit: Payer: Self-pay | Admitting: *Deleted

## 2016-04-26 ENCOUNTER — Encounter: Payer: Self-pay | Admitting: *Deleted

## 2016-04-26 NOTE — Patient Outreach (Signed)
Third attempt made to contact telephonically as part of ongoing transition of care. Unable to leave a voice message as message on phone- wireless customer not available, please try again.   Home visit done 5/10 as part of transition of care, unable to contact thereafter.  Plan to send unable to contact letter, if no response in 10 business days, will close case,notify MD.    Zara Chess.   [facility]  [phone]

## 2016-05-02 ENCOUNTER — Ambulatory Visit: Payer: Self-pay | Admitting: *Deleted

## 2016-05-11 ENCOUNTER — Encounter: Payer: Self-pay | Admitting: *Deleted

## 2016-05-11 ENCOUNTER — Other Ambulatory Visit: Payer: Self-pay | Admitting: *Deleted

## 2016-11-17 ENCOUNTER — Other Ambulatory Visit: Payer: Self-pay

## 2016-11-17 NOTE — Telephone Encounter (Signed)
Request for Omeprazole 20mg  tablet.  Last written August of 2016 with 1 year supply.  Last seen 12/25/2015 Patient has no showed for her last 2 appointments.

## 2017-05-05 ENCOUNTER — Ambulatory Visit: Payer: Medicare HMO

## 2018-04-06 ENCOUNTER — Telehealth: Payer: Self-pay | Admitting: Unknown Physician Specialty

## 2018-04-06 NOTE — Telephone Encounter (Signed)
Copied from [place] [id number]. Topic: Medicare AWV >> Apr 06, 2018 10:26 AM Leo Rod wrote: Called to schedule Medicare Annual Wellness Visit with Nurse Health Advisor. If patient returns call please note: their last AWV was on 9/26 /16 please schedule AWV with NHA any date Thank you! For any questions please contact: Jill Alexanders [phone]  Skype [email or web address]

## 2021-10-11 ENCOUNTER — Encounter: Payer: Self-pay | Admitting: Otolaryngology

## 2021-10-11 NOTE — Discharge Instructions (Signed)
[facility] DISCHARGE INSTRUCTIONS FOR MYRINGOTOMY AND TUBE INSERTION  [facility] Margaretha Sheffield, M.D.  Diet:   After surgery, the patient should take only liquids and foods as tolerated.  The patient may then have a regular diet after the effects of anesthesia have worn off, usually about four to six hours after surgery.  Activities:   The patient should rest until the effects of anesthesia have worn off.  After this, there are no restrictions on the normal daily activities.  Medications:   You will be given antibiotic drops to be used in the ears postoperatively.  It is recommended to use 3 drops 3 times a day for 3 days, then the drops should be saved for possible future use.  The tubes should not cause any discomfort to the patient, but if there is any question, Tylenol should be given according to the instructions for the age of the patient.  Other medications should be continued normally.  Precautions:   Should there be recurrent drainage after the tubes are placed, the drops should be used for approximately 3-4 days.  If it does not clear, you should call the ENT office.  Earplugs:   Earplugs are only needed for those who are going to be submerged under water.  When taking a bath or shower and using a cup or showerhead to rinse hair, it is not necessary to wear earplugs.  These come in a variety of fashions, all of which can be obtained at our office.  However, if one is not able to come by the office, then silicone plugs can be found at most pharmacies.  It is not advised to stick anything in the ear that is not approved as an earplug.  Silly putty is not to be used as an earplug.  Swimming is allowed in patients after ear tubes are inserted, however, they must wear earplugs if they are going to be submerged under water.  For those children who are going to be swimming a lot, it is recommended to use a fitted ear mold, which can be made by our audiologist.   If discharge is noticed from the ears, this most likely represents an ear infection.  We would recommend getting your eardrops and using them as indicated above.  If it does not clear, then you should call the ENT office.  For follow up, the patient should return to the ENT office three weeks postoperatively and then every six months as required by the doctor.

## 2021-10-14 ENCOUNTER — Ambulatory Visit
Admission: RE | Admit: 2021-10-14 | Discharge: 2021-10-14 | Disposition: A | Discharge status: Home / Self Care | Payer: Medicare HMO | Attending: Otolaryngology | Admitting: Otolaryngology

## 2021-10-14 ENCOUNTER — Encounter: Payer: Self-pay | Admitting: Otolaryngology

## 2021-10-14 ENCOUNTER — Ambulatory Visit: Payer: Medicare HMO | Admitting: Anesthesiology

## 2021-10-14 ENCOUNTER — Encounter
Admission: RE | Disposition: A | Discharge status: Home / Self Care | Payer: Self-pay | Source: Home / Self Care | Attending: Otolaryngology

## 2021-10-14 DIAGNOSIS — Z9981 Dependence on supplemental oxygen: Secondary | ICD-10-CM | POA: Insufficient documentation

## 2021-10-14 DIAGNOSIS — H6523 Chronic serous otitis media, bilateral: Secondary | ICD-10-CM | POA: Insufficient documentation

## 2021-10-14 DIAGNOSIS — F17210 Nicotine dependence, cigarettes, uncomplicated: Secondary | ICD-10-CM | POA: Insufficient documentation

## 2021-10-14 DIAGNOSIS — H9193 Unspecified hearing loss, bilateral: Secondary | ICD-10-CM | POA: Diagnosis not present

## 2021-10-14 DIAGNOSIS — I4891 Unspecified atrial fibrillation: Secondary | ICD-10-CM | POA: Insufficient documentation

## 2021-10-14 DIAGNOSIS — K219 Gastro-esophageal reflux disease without esophagitis: Secondary | ICD-10-CM | POA: Diagnosis not present

## 2021-10-14 DIAGNOSIS — F0390 Unspecified dementia without behavioral disturbance: Secondary | ICD-10-CM | POA: Diagnosis not present

## 2021-10-14 DIAGNOSIS — I10 Essential (primary) hypertension: Secondary | ICD-10-CM | POA: Diagnosis not present

## 2021-10-14 DIAGNOSIS — J449 Chronic obstructive pulmonary disease, unspecified: Secondary | ICD-10-CM | POA: Insufficient documentation

## 2021-10-14 DIAGNOSIS — H6993 Unspecified Eustachian tube disorder, bilateral: Secondary | ICD-10-CM | POA: Insufficient documentation

## 2021-10-14 DIAGNOSIS — E039 Hypothyroidism, unspecified: Secondary | ICD-10-CM | POA: Insufficient documentation

## 2021-10-14 HISTORY — PX: MYRINGOTOMY WITH TUBE PLACEMENT: SHX5663

## 2021-10-14 HISTORY — DX: Other amnesia: R41.3

## 2021-10-14 SURGERY — MYRINGOTOMY WITH TUBE PLACEMENT
Anesthesia: General | Site: Ear | Laterality: Bilateral

## 2021-10-14 MED ORDER — GLYCOPYRROLATE 0.2 MG/ML IJ SOLN
INTRAMUSCULAR | Status: DC | PRN
  Administered 2021-10-14: .1 mg via INTRAVENOUS

## 2021-10-14 MED ORDER — MIDAZOLAM HCL 5 MG/5ML IJ SOLN
INTRAMUSCULAR | Status: DC | PRN
  Administered 2021-10-14: 1 mg via INTRAVENOUS

## 2021-10-14 MED ORDER — LACTATED RINGERS IV SOLN: INTRAVENOUS | Status: DC

## 2021-10-14 MED ORDER — ALBUTEROL SULFATE (2.5 MG/3ML) 0.083% IN NEBU
2.5000 mg | INHALATION_SOLUTION | Freq: Once | RESPIRATORY_TRACT | Status: AC
  Administered 2021-10-14: 11:00:00 2.5 mg via RESPIRATORY_TRACT

## 2021-10-14 MED ORDER — PROPOFOL 10 MG/ML IV BOLUS
INTRAVENOUS | Status: DC | PRN
  Administered 2021-10-14: 70 mg via INTRAVENOUS

## 2021-10-14 MED ORDER — CIPROFLOXACIN-DEXAMETHASONE 0.3-0.1 % OT SUSP
OTIC | Status: DC | PRN
  Administered 2021-10-14: 1 [drp] via OTIC

## 2021-10-14 MED ORDER — LIDOCAINE HCL (CARDIAC) PF 100 MG/5ML IV SOSY
PREFILLED_SYRINGE | INTRAVENOUS | Status: DC | PRN
  Administered 2021-10-14: 50 mg via INTRAVENOUS

## 2021-10-14 MED ORDER — ONDANSETRON HCL 4 MG/2ML IJ SOLN
INTRAMUSCULAR | Status: DC | PRN
  Administered 2021-10-14: 4 mg via INTRAVENOUS

## 2021-10-14 SURGICAL SUPPLY — 10 items
BALL CTTN LRG ABS STRL LF (GAUZE/BANDAGES/DRESSINGS) ×1
BLADE MYR LANCE NRW W/HDL (BLADE) ×2 IMPLANT
CANISTER SUCT 1200ML W/VALVE (MISCELLANEOUS) ×2 IMPLANT
COTTONBALL LRG STERILE PKG (GAUZE/BANDAGES/DRESSINGS) ×2 IMPLANT
GLOVE SURG GAMMEX PI TX LF 7.5 (GLOVE) ×2 IMPLANT
STRAP BODY AND KNEE 60X3 (MISCELLANEOUS) ×2 IMPLANT
TOWEL OR 17X26 4PK STRL BLUE (TOWEL DISPOSABLE) ×2 IMPLANT
TUBE EAR ARMSTRONG FL 1.14X4.5 (OTOLOGIC RELATED) ×4 IMPLANT
TUBING CONN 6MMX3.1M (TUBING) ×1
TUBING SUCTION CONN 0.25 STRL (TUBING) ×1 IMPLANT

## 2021-10-14 NOTE — Transfer of Care (Signed)
Immediate Anesthesia Transfer of Care Note  Patient: Paula Krause  Procedure(s) Performed: BILATERAL MYRINGOTOMY WITH TUBE PLACEMENT (Bilateral: Ear)  Patient Location: PACU  Anesthesia Type: General  Level of Consciousness: awake, alert  and patient cooperative  Airway and Oxygen Therapy: Patient Spontanous Breathing and Patient connected to supplemental oxygen  Post-op Assessment: Post-op Vital signs reviewed, Patient's Cardiovascular Status Stable, Respiratory Function Stable, Patent Airway and No signs of Nausea or vomiting  Post-op Vital Signs: Reviewed and stable  Complications: No notable events documented.

## 2021-10-14 NOTE — Anesthesia Procedure Notes (Signed)
Procedure Name: General with mask airway Date/Time: 10/14/2021 12:08 PM Performed by: Mayme Genta, CRNA Pre-anesthesia Checklist: Patient identified, Patient being monitored, Emergency Drugs available, Timeout performed and Suction available Patient Re-evaluated:Patient Re-evaluated prior to induction Oxygen Delivery Method: Circle system utilized Preoxygenation: Pre-oxygenation with 100% oxygen Induction Type: Combination inhalational/ intravenous induction Ventilation: Mask ventilation without difficulty Dental Injury: Teeth and Oropharynx as per pre-operative assessment

## 2021-10-14 NOTE — Anesthesia Preprocedure Evaluation (Addendum)
Anesthesia Evaluation  Patient identified by MRN, date of birth, ID band Patient awake    Reviewed: Allergy & Precautions, NPO status , Patient's Chart, lab work & pertinent test results, reviewed documented beta blocker date and time   History of Anesthesia Complications Negative for: history of anesthetic complications  Airway Mallampati: III  TM Distance: >3 FB Neck ROM: Limited    Dental  (+) Upper Dentures, Partial Lower   Pulmonary asthma , COPD (O2 @ night prn, did not take COPD inhalers this AM),  oxygen dependent, Current SmokerPatient did not abstain from smoking.,    breath sounds clear to auscultation + decreased breath sounds      Cardiovascular hypertension, Pt. on medications and Pt. on home beta blockers (-) angina(-) DOE + dysrhythmias Atrial Fibrillation  Rhythm:Regular Rate:Normal     Neuro/Psych Anxiety Depression Dementia    GI/Hepatic GERD  ,  Endo/Other  Hypothyroidism   Renal/GU      Musculoskeletal   Abdominal   Peds  Hematology   Anesthesia Other Findings   Reproductive/Obstetrics                           Anesthesia Physical Anesthesia Plan  ASA: 3  Anesthesia Plan: General   Post-op Pain Management:    Induction: Intravenous  PONV Risk Score and Plan: 2 and Propofol infusion, TIVA and Treatment may vary due to age or medical condition  Airway Management Planned: Natural Airway and Nasal Cannula  Additional Equipment:   Intra-op Plan:   Post-operative Plan:   Informed Consent: I have reviewed the patients History and Physical, chart, labs and discussed the procedure including the risks, benefits and alternatives for the proposed anesthesia with the patient or authorized representative who has indicated his/her understanding and acceptance.       Plan Discussed with: CRNA and Anesthesiologist  Anesthesia Plan Comments:         Anesthesia  Quick Evaluation

## 2021-10-14 NOTE — H&P (Signed)
H&P has been reviewed and patient reevaluated, no changes necessary. To be downloaded later.  

## 2021-10-14 NOTE — Op Note (Signed)
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  Short Armstrong 5 tubes were placed bilaterally.  There was lots of thick yellow fluid suctioned from behind the eardrum on both sides.  Procedure: With the patient in a comfortable supine position, general mask anesthesia was administered.  At an appropriate level, microscope and speculum were used to examine and clean the RIGHT ear canal.  The findings were as described above.  An anterior inferior radial myringotomy incision was sharply executed.  Middle ear contents were suctioned clear.  A PE tube was placed without difficulty.  Ciprodex otic solution was instilled into the external canal, and insufflated into the middle ear.  A cotton ball was placed at the external meatus. Hemostasis was observed.  This side was completed.  After completing the RIGHT side, the LEFT side was done in identical fashion.  On her left side there was significant tympanosclerosis of the eardrum.  When I made the incision in the anterior-inferior quadrant and went through the skin but then pushed down the calcium plaque into the middle ear.  The tube was able to be placed around it so that it went into the middle ear space.  Ciprodex drops were placed again in the ear canal to cover over the tube and were insufflated into the middle ear.  A cotton ball was placed again at the external meatus.  The patient tolerated the procedure well.  There were no operative complications.  Following this  The patient was returned to anesthesia, awakened, and transferred to recovery in stable condition.  Dispo:  PACU to home  Plan: Routine drop use and water precautions.   Recheck my office three weeks with an audiogram.   Huey Romans 12:25 PM 10/14/2021

## 2021-10-14 NOTE — Anesthesia Postprocedure Evaluation (Signed)
Anesthesia Post Note  Patient: Paula Krause  Procedure(s) Performed: BILATERAL MYRINGOTOMY WITH TUBE PLACEMENT (Bilateral: Ear)     Patient location during evaluation: PACU Anesthesia Type: General Level of consciousness: awake and alert Pain management: pain level controlled Vital Signs Assessment: post-procedure vital signs reviewed and stable Respiratory status: spontaneous breathing, nonlabored ventilation, respiratory function stable and patient connected to nasal cannula oxygen Cardiovascular status: blood pressure returned to baseline and stable Postop Assessment: no apparent nausea or vomiting Anesthetic complications: no   No notable events documented.  Gordie Crumby A  [name]

## 2021-10-15 ENCOUNTER — Encounter: Payer: Self-pay | Admitting: Otolaryngology

## 2022-10-27 ENCOUNTER — Other Ambulatory Visit: Payer: Self-pay

## 2022-10-27 DIAGNOSIS — Z1231 Encounter for screening mammogram for malignant neoplasm of breast: Secondary | ICD-10-CM

## 2022-10-27 DIAGNOSIS — M8589 Other specified disorders of bone density and structure, multiple sites: Secondary | ICD-10-CM

## 2023-10-30 ENCOUNTER — Ambulatory Visit: Payer: Medicare HMO | Attending: Family Medicine

## 2023-10-30 DIAGNOSIS — M6281 Muscle weakness (generalized): Secondary | ICD-10-CM | POA: Diagnosis present

## 2023-10-30 DIAGNOSIS — M47816 Spondylosis without myelopathy or radiculopathy, lumbar region: Secondary | ICD-10-CM | POA: Insufficient documentation

## 2023-10-30 NOTE — Therapy (Signed)
OUTPATIENT PHYSICAL THERAPY THORACOLUMBAR EVALUATION   Patient Name: Paula Krause MRN: [id number] DOB:[birth year], 82 y.o., female Today's Date: 11/06/2023  END OF SESSION: 10/30/23  PT End of Session - 11/06/23 1026     Visit Number 1    Number of Visits 13    Date for PT Re-Evaluation 12/18/23    Authorization Type 2x/week x 6 weeks (PN at visit 10, recert at 12/18/23)    PT Start Time 1245    PT Stop Time 1330    PT Time Calculation (min) 45 min    Activity Tolerance Patient tolerated treatment well    Behavior During Therapy [facility] for tasks assessed/performed             Past Medical History:  Diagnosis Date   Anxiety    Asthma    Cataract    right eye   COPD (chronic obstructive pulmonary disease) (HCC)    Depression    Esophagitis    GERD (gastroesophageal reflux disease)    Insomnia    Memory difficulties    Osteoporosis    Shortness of breath dyspnea    Tobacco abuse    Wears dentures    full upper, partial lower   Past Surgical History:  Procedure Laterality Date   ABDOMINAL HYSTERECTOMY     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   COLONOSCOPY WITH PROPOFOL N/A 01/15/2016   Procedure: COLONOSCOPY WITH PROPOFOL;  Surgeon: Midge Minium, MD;  Location: [facility];  Service: Endoscopy;  Laterality: N/A;   MYRINGOTOMY WITH TUBE PLACEMENT Bilateral 10/14/2021   Procedure: BILATERAL MYRINGOTOMY WITH TUBE PLACEMENT;  Surgeon: Vernie Murders, MD;  Location: [facility];  Service: ENT;  Laterality: Bilateral;   POLYPECTOMY  01/15/2016   Procedure: POLYPECTOMY;  Surgeon: Midge Minium, MD;  Location: [facility];  Service: Endoscopy;;   Patient Active Problem List   Diagnosis Date Noted   Atrial fibrillation with rapid ventricular response (HCC)    Pneumonia    Arterial hypotension    Smoker    Centrilobular emphysema (HCC)    Sepsis (HCC) 02/23/2016   Special screening for malignant neoplasms, colon    Benign neoplasm of sigmoid colon     Black stools 12/25/2015   Allergic rhinitis 08/24/2015   Asthma    COPD, severe (HCC)    Depression    Anxiety    Esophagitis    GERD (gastroesophageal reflux disease)    Insomnia    Osteoporosis    Tobacco abuse     PCP: Dr. Vinson Moselle, MD  REFERRING PROVIDER: Burman Freestone, FNP  REFERRING DIAG: [phone]   Rationale for Evaluation and Treatment: Rehabilitation  THERAPY DIAG:  Spondylosis of lumbar region without myelopathy or radiculopathy  Muscle weakness (generalized)  ONSET DATE: chronic  SUBJECTIVE:  SUBJECTIVE STATEMENT: Pt reports she is having LBP.  She reports it has been going on for awhile.  She reports she feels worse with standing >10 min consecutively  Functionally- she is having difficulty with making her bed; has to split up the activity.    Saw MD; was referred to PT and she reports trying some medication that she does not notice a big difference.  Aggravating: prolonged standing, making her bed, doing housechores/housework, going up/down stairs   Alleviating: heat, warm showers  PERTINENT HISTORY:  Chronic low back pain, no recent MOI or changes in status noted; no N/T noted, sx remain in low back  PAIN:  Are you having pain? Yes, "constant/achy", currently 7/10, 0/10 at best, 8/10 at worst  PRECAUTIONS: None  RED FLAGS: None   WEIGHT BEARING RESTRICTIONS: No  FALLS:  Has patient fallen in last 6 months? No; then later stated she did fall a few months ago while carrying her groceries  LIVING ENVIRONMENT: Lives with: lives alone, has family nearby, has a little Financial trader" at home  Lives in: House/apartment Stairs: No Has following equipment at home: Single point cane- has it but does not need it  OCCUPATION: retired  PLOF: Independent  PATIENT GOALS: to  be able to perform her daily activities without being limited by her low back pain- specifically activities in her home  NEXT MD VISIT: 11/27/23  OBJECTIVE:  Note: Objective measures were completed at Evaluation unless otherwise noted.  DIAGNOSTIC FINDINGS:  Xrays from [facility] dated 09/07/2023 demonstrated osteopenia without fracture and mild degenerative changes noted (per chart review)   PATIENT SURVEYS:  FOTO will be assessed at future visit (clinic lost power and internet during today's session, unable to access FOTO)  SCREENING FOR RED FLAGS: Compression fracture: has osteopenia; recent x-ray in Oct (-) compression fx  COGNITION: Overall cognitive status: Within functional limits for tasks assessed     SENSATION: WFL  POSTURE: decreased lumbar lordosis  PALPATION:  Tender, but pt reports not her typical sx with palpation of lumbar L1-4, surrounding mm  LUMBAR ROM:   AROM eval  Flexion 25% limited  Extension 50% limited painful  Right lateral flexion 50% limited  painful  Left lateral flexion 50% limited  Right rotation 25% limited  Left rotation 25% limited   (Blank rows = not tested)  LOWER EXTREMITY ROM:     Active  Right eval Left eval  Hip flexion 4 4  Hip extension    Hip abduction 4 4  Hip adduction    Hip internal rotation    Hip external rotation    Knee flexion 4 4  Knee extension 4 4  Ankle dorsiflexion 4 4  Ankle plantarflexion    Ankle inversion    Ankle eversion     (Blank rows = not tested)  LOWER EXTREMITY MMT:    MMT Right eval Left eval  Hip flexion [facility]  Hip extension    Hip abduction    Hip adduction (-) FADDIR (-) FADDIR  Hip internal rotation    Hip external rotation    Knee flexion    Knee extension    Ankle dorsiflexion    Ankle plantarflexion    Ankle inversion    Ankle eversion     (Blank rows = not tested)  LUMBAR SPECIAL TESTS:  Slump test: Negative (-) LQ neuro screen- symmetrical reflexes, dermatomes  normal, myotomes equal  FUNCTIONAL TESTS:  5 times sit to stand: deferred , pt able to perform  sit to stand and requires use of UE support Pt able to lie supine on table Pt able to transfer sit to sidelying to supine, and roll to prone independently  GAIT: Pt amb without assistive device today into/out of clinic; amb with decreased gait speed, decreased trunk rotation and decreased hip extension during push off b/l  TODAY'S TREATMENT:                                                                                                                              DATE: 10/30/23    PATIENT EDUCATION:  Education details: PT POC/goals, purpose of PT to address pain and mobility and strength deficits Person educated: Patient Education method: Explanation Education comprehension: verbalized understanding  HOME EXERCISE PROGRAM: To be initiated at next session (clinic lost power and internet during session, unable to access medbridge)  ASSESSMENT:  CLINICAL IMPRESSION: Patient is a 82 y.o. F who was seen today for physical therapy evaluation and treatment for chronic LBP associated with lumbar spondylosis.  She is an appropriate candidate for skilled PT to address the impairments listed below and to facilitate improved ability to perform her daily activities at home without being limited by LBP.   OBJECTIVE IMPAIRMENTS: Abnormal gait, decreased activity tolerance, decreased mobility, decreased ROM, decreased strength, hypomobility, impaired perceived functional ability, and pain.   ACTIVITY LIMITATIONS: carrying, lifting, bending, standing, squatting, and locomotion level  PARTICIPATION LIMITATIONS: meal prep, cleaning, laundry, shopping, and community activity  PERSONAL FACTORS: Age and Time since onset of injury/illness/exacerbation are also affecting patient's functional outcome.   REHAB POTENTIAL: Good  CLINICAL DECISION MAKING: Stable/uncomplicated  EVALUATION COMPLEXITY:  Low   GOALS: Goals reviewed with patient? Yes  SHORT TERM GOALS: Target date: 11/17/23  Pt will be independent with HEP for lumbar spine/LE ROM and mobility Baseline: to be initiated Goal status: INITIAL   LONG TERM GOALS: Target date: 12/18/23  FOTO goal will be established at next tx session Baseline:  Goal status: INITIAL  2.  Pt will demonstrate improved ability to stand x 15 minutes with <4/10 LBP to be able to change sheets/make bed without needing to stop to rest due to LBP Baseline: not able to complete it at one time and notes 8/10 LBP Goal status: INITIAL  3.  Improve LE strength x >1/2 MMT grade to promote improved ability to stand, walk, and perform her daily activities at home x 20 min intervals without being limited by LBP Baseline: 4/5 LE strength Goal status: INITIAL   PLAN:  PT FREQUENCY: 2x/week  PT DURATION: 6 weeks  PLANNED INTERVENTIONS: 97110-Therapeutic exercises, 97530- Therapeutic activity, 97112- Neuromuscular re-education, 97535- Self Care, and 13086- Manual therapy.  PLAN FOR NEXT SESSION: lumbar spine/LE ROM and mobility exercises, modalities for pain control, initiate HEP   Max Fickle, PT, DPT, OCS   Ardine Bjork, PT 11/06/2023, 10:26 AM

## 2023-11-01 ENCOUNTER — Ambulatory Visit: Payer: Medicare HMO

## 2023-11-01 DIAGNOSIS — M47816 Spondylosis without myelopathy or radiculopathy, lumbar region: Secondary | ICD-10-CM | POA: Diagnosis not present

## 2023-11-06 ENCOUNTER — Ambulatory Visit: Payer: Medicare HMO

## 2023-11-06 DIAGNOSIS — M6281 Muscle weakness (generalized): Secondary | ICD-10-CM

## 2023-11-06 DIAGNOSIS — M47816 Spondylosis without myelopathy or radiculopathy, lumbar region: Secondary | ICD-10-CM

## 2023-11-06 NOTE — Therapy (Signed)
OUTPATIENT PHYSICAL THERAPY THORACOLUMBAR TREATMENT   Patient Name: Paula Krause MRN: [id number] DOB:[birth year], 82 y.o., female Today's Date: 11/06/2023  END OF SESSION: 10/30/23  PT End of Session - 11/06/23 1423     Visit Number 3    Number of Visits 13    Date for PT Re-Evaluation 12/18/23    Authorization Type 2x/week x 6 weeks (PN at visit 10, recert at 12/18/23)    PT Start Time 1250    PT Stop Time 1330    PT Time Calculation (min) 40 min    Activity Tolerance Patient tolerated treatment well    Behavior During Therapy [facility] for tasks assessed/performed             Past Medical History:  Diagnosis Date   Anxiety    Asthma    Cataract    right eye   COPD (chronic obstructive pulmonary disease) (HCC)    Depression    Esophagitis    GERD (gastroesophageal reflux disease)    Insomnia    Memory difficulties    Osteoporosis    Shortness of breath dyspnea    Tobacco abuse    Wears dentures    full upper, partial lower   Past Surgical History:  Procedure Laterality Date   ABDOMINAL HYSTERECTOMY     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   COLONOSCOPY WITH PROPOFOL N/A 01/15/2016   Procedure: COLONOSCOPY WITH PROPOFOL;  Surgeon: Midge Minium, MD;  Location: [facility];  Service: Endoscopy;  Laterality: N/A;   MYRINGOTOMY WITH TUBE PLACEMENT Bilateral 10/14/2021   Procedure: BILATERAL MYRINGOTOMY WITH TUBE PLACEMENT;  Surgeon: Vernie Murders, MD;  Location: [facility];  Service: ENT;  Laterality: Bilateral;   POLYPECTOMY  01/15/2016   Procedure: POLYPECTOMY;  Surgeon: Midge Minium, MD;  Location: [facility];  Service: Endoscopy;;   Patient Active Problem List   Diagnosis Date Noted   Atrial fibrillation with rapid ventricular response (HCC)    Pneumonia    Arterial hypotension    Smoker    Centrilobular emphysema (HCC)    Sepsis (HCC) 02/23/2016   Special screening for malignant neoplasms, colon    Benign neoplasm of sigmoid colon     Black stools 12/25/2015   Allergic rhinitis 08/24/2015   Asthma    COPD, severe (HCC)    Depression    Anxiety    Esophagitis    GERD (gastroesophageal reflux disease)    Insomnia    Osteoporosis    Tobacco abuse     PCP: Dr. Vinson Moselle, MD  REFERRING PROVIDER: Burman Freestone, FNP  REFERRING DIAG: [phone]   Rationale for Evaluation and Treatment: Rehabilitation  THERAPY DIAG:  Spondylosis of lumbar region without myelopathy or radiculopathy  Muscle weakness (generalized)  ONSET DATE: chronic  SUBJECTIVE:      From initial evaluation note 10/30/23  SUBJECTIVE STATEMENT: Pt reports she is having LBP.  She reports it has been going on for awhile.  She reports she feels worse with standing >10 min consecutively  Functionally- she is having difficulty with making her bed; has to split up the activity.    Saw MD; was referred to PT and she reports trying some medication that she does not notice a big difference.  Aggravating: prolonged standing, making her bed, doing housechores/housework, going up/down stairs   Alleviating: heat, warm showers  PERTINENT HISTORY:  Chronic low back pain, no recent MOI or changes in status noted; no N/T noted, sx remain in low back  PAIN:  Are you having pain? Yes, "constant/achy", currently 7/10, 0/10 at best, 8/10 at worst  PRECAUTIONS: None  RED FLAGS: None   WEIGHT BEARING RESTRICTIONS: No  FALLS:  Has patient fallen in last 6 months? No; then later stated she did fall a few months ago while carrying her groceries  LIVING ENVIRONMENT: Lives with: lives alone, has family nearby, has a little Financial trader" at home  Lives in: House/apartment Stairs: No Has following equipment at home: Single point cane- has it but does not need it  OCCUPATION:  retired  PLOF: Independent  PATIENT GOALS: to be able to perform her daily activities without being limited by her low back pain- specifically activities in her home  NEXT MD VISIT: 11/27/23  OBJECTIVE:  Note: Objective measures were completed at Evaluation unless otherwise noted.  DIAGNOSTIC FINDINGS:  Xrays from [facility] dated 09/07/2023 demonstrated osteopenia without fracture and mild degenerative changes noted (per chart review)   PATIENT SURVEYS:  FOTO will be assessed at future visit (clinic lost power and internet during today's session, unable to access FOTO)  SCREENING FOR RED FLAGS: Compression fracture: has osteopenia; recent x-ray in Oct (-) compression fx  COGNITION: Overall cognitive status: Within functional limits for tasks assessed     SENSATION: WFL  POSTURE: decreased lumbar lordosis  PALPATION:  Tender, but pt reports not her typical sx with palpation of lumbar L1-4, surrounding mm  LUMBAR ROM:   AROM eval  Flexion 25% limited  Extension 50% limited painful  Right lateral flexion 50% limited  painful  Left lateral flexion 50% limited  Right rotation 25% limited  Left rotation 25% limited   (Blank rows = not tested)  LOWER EXTREMITY ROM:     Active  Right eval Left eval  Hip flexion 4 4  Hip extension    Hip abduction 4 4  Hip adduction    Hip internal rotation    Hip external rotation    Knee flexion 4 4  Knee extension 4 4  Ankle dorsiflexion 4 4  Ankle plantarflexion    Ankle inversion    Ankle eversion     (Blank rows = not tested)  LOWER EXTREMITY MMT:    MMT Right eval Left eval  Hip flexion [facility]  Hip extension    Hip abduction    Hip adduction (-) FADDIR (-) FADDIR  Hip internal rotation    Hip external rotation    Knee flexion    Knee extension    Ankle dorsiflexion    Ankle plantarflexion    Ankle inversion    Ankle eversion     (Blank rows = not tested)  LUMBAR SPECIAL TESTS:  Slump test: Negative (-) LQ  neuro screen- symmetrical reflexes, dermatomes normal, myotomes equal  FUNCTIONAL TESTS:  5 times sit to stand: deferred , pt able to perform  sit to stand and requires use of UE support Pt able to lie supine on table Pt able to transfer sit to sidelying to supine, and roll to prone independently  GAIT: Pt amb without assistive device today into/out of clinic; amb with decreased gait speed, decreased trunk rotation and decreased hip extension during push off b/l  TODAY'S TREATMENT:                                                                                                                              DATE: 11/06/23  Subjective: Pt reports she tried the HEP; she feels like it is helping as she is moving around a little better at home today.    Pain 5/10  Objective: Moist heat x 10 min to lumbar spine to start (unbilled time)- not today  Therapeutic Exercises: SKTC: 30 seconds x 3 ea LE Hamstring stretch with PT: 1 min ea Supine hooklying trunk rotation x 10 x 10 seconds Seated physioball roll out: 10 Seated side bend to b/l: 5x 10 seconds Seated scapular retractions: 2x10- PT tactile/verbal/visual cues Standing scapular retractions: x15- PT tactile/verbal/visual cues Supine hooklying hip add with ball: 3 second holds x 15 Supine hooklying hip abd with ball: 3 second holds x 15  Pt education regarding biomechanics and the benefit of practicing upright posture for LBP sx control  HEP instruction/education: see below, discussed how to perform HEP and benefits Exercises  Access Code: [email or web address] URL: [email or web address] Date: 11/06/2023 Prepared by: Max Fickle  Exercises - Hooklying Single Knee to Chest Stretch  - 1 x daily - 7 x weekly - 10 reps - 10 hold - Supine Lower Trunk Rotation  - 1 x daily - 7 x weekly - 10 reps - 10 hold - Seated Forward Bending with PLB  - 1 x daily - 7 x weekly - 10 reps - Seated Sidebending  - 1 x daily - 7 x weekly - 5 reps -  10 hold - Seated Hip Abduction with Resistance  - 1 x daily - 7 x weekly - 2 sets - 10 reps - Standing Scapular Retraction  - 1 x daily - 7 x weekly - 2 sets - 10 reps  PATIENT EDUCATION:  Education details: PT POC/goals, purpose of PT to address pain and mobility and strength deficits Person educated: Patient Education method: Explanation Education comprehension: verbalized understanding  HOME EXERCISE PROGRAM: Access Code: [email or web address]  ASSESSMENT:  CLINICAL IMPRESSION: Patient arrived today reporting less pain than previous tx session.  She reports compliance with her HEP.  She was able to tolerate gentle lumbopelvic AROM/stretches today and introduced strengthening to tx too.  She would benefit from more practice with scapular mm retraining as motor control is significantly impaired.  She is an appropriate candidate for skilled PT to address the impairments listed below and to facilitate improved ability to perform her daily activities at home without being limited by LBP.   OBJECTIVE IMPAIRMENTS: Abnormal gait, decreased  activity tolerance, decreased mobility, decreased ROM, decreased strength, hypomobility, impaired perceived functional ability, and pain.   ACTIVITY LIMITATIONS: carrying, lifting, bending, standing, squatting, and locomotion level  PARTICIPATION LIMITATIONS: meal prep, cleaning, laundry, shopping, and community activity  PERSONAL FACTORS: Age and Time since onset of injury/illness/exacerbation are also affecting patient's functional outcome.   REHAB POTENTIAL: Good  CLINICAL DECISION MAKING: Stable/uncomplicated  EVALUATION COMPLEXITY: Low   GOALS: Goals reviewed with patient? Yes  SHORT TERM GOALS: Target date: 11/17/23  Pt will be independent with HEP for lumbar spine/LE ROM and mobility Baseline: to be initiated Goal status: INITIAL   LONG TERM GOALS: Target date: 12/18/23  FOTO goal will be established at next tx session Baseline:  Goal status:  INITIAL  2.  Pt will demonstrate improved ability to stand x 15 minutes with <4/10 LBP to be able to change sheets/make bed without needing to stop to rest due to LBP Baseline: not able to complete it at one time and notes 8/10 LBP Goal status: INITIAL  3.  Improve LE strength x >1/2 MMT grade to promote improved ability to stand, walk, and perform her daily activities at home x 20 min intervals without being limited by LBP Baseline: 4/5 LE strength Goal status: INITIAL   PLAN:  PT FREQUENCY: 2x/week  PT DURATION: 6 weeks  PLANNED INTERVENTIONS: 97110-Therapeutic exercises, 97530- Therapeutic activity, 97112- Neuromuscular re-education, 97535- Self Care, and 60454- Manual therapy.  PLAN FOR NEXT SESSION: lumbar spine/LE ROM and mobility exercises, modalities for pain control, initiate HEP   Max Fickle, PT, DPT, OCS   Paula Krause, PT 11/06/2023, 2:24 PM

## 2023-11-06 NOTE — Therapy (Signed)
OUTPATIENT PHYSICAL THERAPY THORACOLUMBAR TREATMENT   Patient Name: Paula Krause MRN: [id number] DOB:1941-03-25, 82 y.o., female Today's Date: 11/06/2023  END OF SESSION: 10/30/23  PT End of Session - 11/06/23 1415     Visit Number 2    Number of Visits 13    Date for PT Re-Evaluation 12/18/23    Authorization Type 2x/week x 6 weeks (PN at visit 10, recert at 12/18/23)    PT Start Time 1245    PT Stop Time 1330    PT Time Calculation (min) 45 min    Activity Tolerance Patient tolerated treatment well    Behavior During Therapy [facility] for tasks assessed/performed             Past Medical History:  Diagnosis Date   Anxiety    Asthma    Cataract    right eye   COPD (chronic obstructive pulmonary disease) (HCC)    Depression    Esophagitis    GERD (gastroesophageal reflux disease)    Insomnia    Memory difficulties    Osteoporosis    Shortness of breath dyspnea    Tobacco abuse    Wears dentures    full upper, partial lower   Past Surgical History:  Procedure Laterality Date   ABDOMINAL HYSTERECTOMY     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   COLONOSCOPY WITH PROPOFOL N/A 01/15/2016   Procedure: COLONOSCOPY WITH PROPOFOL;  Surgeon: Midge Minium, MD;  Location: [facility];  Service: Endoscopy;  Laterality: N/A;   MYRINGOTOMY WITH TUBE PLACEMENT Bilateral 10/14/2021   Procedure: BILATERAL MYRINGOTOMY WITH TUBE PLACEMENT;  Surgeon: Vernie Murders, MD;  Location: [facility];  Service: ENT;  Laterality: Bilateral;   POLYPECTOMY  01/15/2016   Procedure: POLYPECTOMY;  Surgeon: Midge Minium, MD;  Location: [facility];  Service: Endoscopy;;   Patient Active Problem List   Diagnosis Date Noted   Atrial fibrillation with rapid ventricular response (HCC)    Pneumonia    Arterial hypotension    Smoker    Centrilobular emphysema (HCC)    Sepsis (HCC) 02/23/2016   Special screening for malignant neoplasms, colon    Benign neoplasm of sigmoid colon     Black stools 12/25/2015   Allergic rhinitis 08/24/2015   Asthma    COPD, severe (HCC)    Depression    Anxiety    Esophagitis    GERD (gastroesophageal reflux disease)    Insomnia    Osteoporosis    Tobacco abuse     PCP: Dr. Vinson Moselle, MD  REFERRING PROVIDER: Burman Freestone, FNP  REFERRING DIAG: [phone]   Rationale for Evaluation and Treatment: Rehabilitation  THERAPY DIAG:  Spondylosis of lumbar region without myelopathy or radiculopathy  ONSET DATE: chronic  SUBJECTIVE:      From initial evaluation note 10/30/23  SUBJECTIVE STATEMENT: Pt reports she is having LBP.  She reports it has been going on for awhile.  She reports she feels worse with standing >10 min consecutively  Functionally- she is having difficulty with making her bed; has to split up the activity.    Saw MD; was referred to PT and she reports trying some medication that she does not notice a big difference.  Aggravating: prolonged standing, making her bed, doing housechores/housework, going up/down stairs   Alleviating: heat, warm showers  PERTINENT HISTORY:  Chronic low back pain, no recent MOI or changes in status noted; no N/T noted, sx remain in low back  PAIN:  Are you having pain? Yes, "constant/achy", currently 7/10, 0/10 at best, 8/10 at worst  PRECAUTIONS: None  RED FLAGS: None   WEIGHT BEARING RESTRICTIONS: No  FALLS:  Has patient fallen in last 6 months? No; then later stated she did fall a few months ago while carrying her groceries  LIVING ENVIRONMENT: Lives with: lives alone, has family nearby, has a little Financial trader" at home  Lives in: House/apartment Stairs: No Has following equipment at home: Single point cane- has it but does not need it  OCCUPATION: retired  PLOF: Independent  PATIENT  GOALS: to be able to perform her daily activities without being limited by her low back pain- specifically activities in her home  NEXT MD VISIT: 11/27/23  OBJECTIVE:  Note: Objective measures were completed at Evaluation unless otherwise noted.  DIAGNOSTIC FINDINGS:  Xrays from [facility] dated 09/07/2023 demonstrated osteopenia without fracture and mild degenerative changes noted (per chart review)   PATIENT SURVEYS:  FOTO will be assessed at future visit (clinic lost power and internet during today's session, unable to access FOTO)  SCREENING FOR RED FLAGS: Compression fracture: has osteopenia; recent x-ray in Oct (-) compression fx  COGNITION: Overall cognitive status: Within functional limits for tasks assessed     SENSATION: WFL  POSTURE: decreased lumbar lordosis  PALPATION:  Tender, but pt reports not her typical sx with palpation of lumbar L1-4, surrounding mm  LUMBAR ROM:   AROM eval  Flexion 25% limited  Extension 50% limited painful  Right lateral flexion 50% limited  painful  Left lateral flexion 50% limited  Right rotation 25% limited  Left rotation 25% limited   (Blank rows = not tested)  LOWER EXTREMITY ROM:     Active  Right eval Left eval  Hip flexion 4 4  Hip extension    Hip abduction 4 4  Hip adduction    Hip internal rotation    Hip external rotation    Knee flexion 4 4  Knee extension 4 4  Ankle dorsiflexion 4 4  Ankle plantarflexion    Ankle inversion    Ankle eversion     (Blank rows = not tested)  LOWER EXTREMITY MMT:    MMT Right eval Left eval  Hip flexion [facility]  Hip extension    Hip abduction    Hip adduction (-) FADDIR (-) FADDIR  Hip internal rotation    Hip external rotation    Knee flexion    Knee extension    Ankle dorsiflexion    Ankle plantarflexion    Ankle inversion    Ankle eversion     (Blank rows = not tested)  LUMBAR SPECIAL TESTS:  Slump test: Negative (-) LQ neuro screen- symmetrical reflexes,  dermatomes normal, myotomes equal  FUNCTIONAL TESTS:  5 times sit to stand: deferred , pt able to perform  sit to stand and requires use of UE support Pt able to lie supine on table Pt able to transfer sit to sidelying to supine, and roll to prone independently  GAIT: Pt amb without assistive device today into/out of clinic; amb with decreased gait speed, decreased trunk rotation and decreased hip extension during push off b/l  TODAY'S TREATMENT:                                                                                                                              DATE: 11/01/23  Subjective: Pt reports she is hurting; the cold weather makes her feel more sore too  Pain 7/10  Objective: Moist heat x 10 min to lumbar spine to start (unbilled time)  Therapeutic Exercises: SKTC: 30 seconds x 3 ea LE Hamstring stretch with PT: 1 min ea Supine hooklying trunk rotation x 10 x 10 seconds Seated physioball roll out: 10 Seated side bend to b/l: 5x 10 seconds  HEP instruction/education: see below, discussed how to perform HEP and benefits Exercises - Hooklying Single Knee to Chest Stretch  - 1 x daily - 7 x weekly - 10 reps - 10 hold - Supine Lower Trunk Rotation  - 1 x daily - 7 x weekly - 10 reps - 10 hold - Seated Forward Bending with PLB  - 1 x daily - 7 x weekly - 10 reps - Seated Sidebending  - 1 x daily - 7 x weekly - 5 reps - 10 hold  PATIENT EDUCATION:  Education details: PT POC/goals, purpose of PT to address pain and mobility and strength deficits Person educated: Patient Education method: Explanation Education comprehension: verbalized understanding  HOME EXERCISE PROGRAM: Access Code: [email or web address]  ASSESSMENT:  CLINICAL IMPRESSION: Patient presents to session today with 7/10 LBP upon arrival.  Started with moist heat for pain control to begin then transitioned into therapeutic exercises after that.  She was able to tolerate gentle lumbopelvic AROM/stretches today and HEP  was initiated.  She is an appropriate candidate for skilled PT to address the impairments listed below and to facilitate improved ability to perform her daily activities at home without being limited by LBP.   OBJECTIVE IMPAIRMENTS: Abnormal gait, decreased activity tolerance, decreased mobility, decreased ROM, decreased strength, hypomobility, impaired perceived functional ability, and pain.   ACTIVITY LIMITATIONS: carrying, lifting, bending, standing, squatting, and locomotion level  PARTICIPATION LIMITATIONS: meal prep, cleaning, laundry, shopping, and community activity  PERSONAL FACTORS: Age and Time since onset of injury/illness/exacerbation are also affecting patient's functional outcome.   REHAB POTENTIAL: Good  CLINICAL DECISION MAKING: Stable/uncomplicated  EVALUATION COMPLEXITY: Low   GOALS: Goals reviewed with patient? Yes  SHORT TERM GOALS: Target date: 11/17/23  Pt will be independent with HEP for lumbar spine/LE ROM and mobility Baseline: to be initiated Goal status: INITIAL   LONG TERM GOALS: Target date: 12/18/23  FOTO goal will be established at next tx session Baseline:  Goal status: INITIAL  2.  Pt will demonstrate improved ability to stand x 15 minutes with <4/10 LBP to be able to change sheets/make bed without needing to stop to rest due to LBP Baseline: not able to complete it at one time and notes 8/10 LBP Goal status: INITIAL  3.  Improve LE strength x >1/2 MMT grade to promote improved ability to stand, walk, and perform her daily activities at home x 20 min intervals without being limited by LBP Baseline: 4/5 LE strength Goal status: INITIAL   PLAN:  PT FREQUENCY: 2x/week  PT DURATION: 6 weeks  PLANNED INTERVENTIONS: 97110-Therapeutic exercises, 97530- Therapeutic activity, 97112- Neuromuscular re-education, 97535- Self Care, and 91478- Manual therapy.  PLAN FOR NEXT SESSION: lumbar spine/LE ROM and mobility exercises, modalities for pain  control, initiate HEP   Max Fickle, PT, DPT, OCS   Ardine Bjork, PT 11/06/2023, 2:16 PM

## 2023-11-13 ENCOUNTER — Ambulatory Visit: Payer: Medicare HMO

## 2023-11-13 DIAGNOSIS — M47816 Spondylosis without myelopathy or radiculopathy, lumbar region: Secondary | ICD-10-CM

## 2023-11-13 DIAGNOSIS — M6281 Muscle weakness (generalized): Secondary | ICD-10-CM

## 2023-11-13 NOTE — Therapy (Signed)
OUTPATIENT PHYSICAL THERAPY THORACOLUMBAR TREATMENT   Patient Name: Paula Krause MRN: [id number] DOB:1941/01/23, 82 y.o., female Today's Date: 11/13/2023  END OF SESSION: 10/30/23  PT End of Session - 11/13/23 1306     Visit Number 4    Number of Visits 13    Date for PT Re-Evaluation 12/18/23    Authorization Type 2x/week x 6 weeks (PN at visit 10, recert at 12/18/23)    PT Start Time 1250    PT Stop Time 1330    PT Time Calculation (min) 40 min    Activity Tolerance Patient tolerated treatment well    Behavior During Therapy [facility] for tasks assessed/performed             Past Medical History:  Diagnosis Date   Anxiety    Asthma    Cataract    right eye   COPD (chronic obstructive pulmonary disease) (HCC)    Depression    Esophagitis    GERD (gastroesophageal reflux disease)    Insomnia    Memory difficulties    Osteoporosis    Shortness of breath dyspnea    Tobacco abuse    Wears dentures    full upper, partial lower   Past Surgical History:  Procedure Laterality Date   ABDOMINAL HYSTERECTOMY     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   COLONOSCOPY WITH PROPOFOL N/A 01/15/2016   Procedure: COLONOSCOPY WITH PROPOFOL;  Surgeon: Midge Minium, MD;  Location: [facility];  Service: Endoscopy;  Laterality: N/A;   MYRINGOTOMY WITH TUBE PLACEMENT Bilateral 10/14/2021   Procedure: BILATERAL MYRINGOTOMY WITH TUBE PLACEMENT;  Surgeon: Vernie Murders, MD;  Location: [facility];  Service: ENT;  Laterality: Bilateral;   POLYPECTOMY  01/15/2016   Procedure: POLYPECTOMY;  Surgeon: Midge Minium, MD;  Location: [facility];  Service: Endoscopy;;   Patient Active Problem List   Diagnosis Date Noted   Atrial fibrillation with rapid ventricular response (HCC)    Pneumonia    Arterial hypotension    Smoker    Centrilobular emphysema (HCC)    Sepsis (HCC) 02/23/2016   Special screening for malignant neoplasms, colon    Benign neoplasm of sigmoid colon     Black stools 12/25/2015   Allergic rhinitis 08/24/2015   Asthma    COPD, severe (HCC)    Depression    Anxiety    Esophagitis    GERD (gastroesophageal reflux disease)    Insomnia    Osteoporosis    Tobacco abuse     PCP: Dr. Vinson Moselle, MD  REFERRING PROVIDER: Burman Freestone, FNP  REFERRING DIAG: [phone]   Rationale for Evaluation and Treatment: Rehabilitation  THERAPY DIAG:  Spondylosis of lumbar region without myelopathy or radiculopathy  Muscle weakness (generalized)  ONSET DATE: chronic  SUBJECTIVE:      From initial evaluation note 10/30/23  SUBJECTIVE STATEMENT: Pt reports she is having LBP.  She reports it has been going on for awhile.  She reports she feels worse with standing >10 min consecutively  Functionally- she is having difficulty with making her bed; has to split up the activity.    Saw MD; was referred to PT and she reports trying some medication that she does not notice a big difference.  Aggravating: prolonged standing, making her bed, doing housechores/housework, going up/down stairs   Alleviating: heat, warm showers  PERTINENT HISTORY:  Chronic low back pain, no recent MOI or changes in status noted; no N/T noted, sx remain in low back  PAIN:  Are you having pain? Yes, "constant/achy", currently 7/10, 0/10 at best, 8/10 at worst  PRECAUTIONS: None  RED FLAGS: None   WEIGHT BEARING RESTRICTIONS: No  FALLS:  Has patient fallen in last 6 months? No; then later stated she did fall a few months ago while carrying her groceries  LIVING ENVIRONMENT: Lives with: lives alone, has family nearby, has a little Financial trader" at home  Lives in: House/apartment Stairs: No Has following equipment at home: Single point cane- has it but does not need it  OCCUPATION:  retired  PLOF: Independent  PATIENT GOALS: to be able to perform her daily activities without being limited by her low back pain- specifically activities in her home  NEXT MD VISIT: 11/27/23  OBJECTIVE:  Note: Objective measures were completed at Evaluation unless otherwise noted.  DIAGNOSTIC FINDINGS:  Xrays from [facility] dated 09/07/2023 demonstrated osteopenia without fracture and mild degenerative changes noted (per chart review)   PATIENT SURVEYS:  FOTO will be assessed at future visit (clinic lost power and internet during today's session, unable to access FOTO)  SCREENING FOR RED FLAGS: Compression fracture: has osteopenia; recent x-ray in Oct (-) compression fx  COGNITION: Overall cognitive status: Within functional limits for tasks assessed     SENSATION: WFL  POSTURE: decreased lumbar lordosis  PALPATION:  Tender, but pt reports not her typical sx with palpation of lumbar L1-4, surrounding mm  LUMBAR ROM:   AROM eval  Flexion 25% limited  Extension 50% limited painful  Right lateral flexion 50% limited  painful  Left lateral flexion 50% limited  Right rotation 25% limited  Left rotation 25% limited   (Blank rows = not tested)  LOWER EXTREMITY ROM:     Active  Right eval Left eval  Hip flexion 4 4  Hip extension    Hip abduction 4 4  Hip adduction    Hip internal rotation    Hip external rotation    Knee flexion 4 4  Knee extension 4 4  Ankle dorsiflexion 4 4  Ankle plantarflexion    Ankle inversion    Ankle eversion     (Blank rows = not tested)  LOWER EXTREMITY MMT:    MMT Right eval Left eval  Hip flexion [facility]  Hip extension    Hip abduction    Hip adduction (-) FADDIR (-) FADDIR  Hip internal rotation    Hip external rotation    Knee flexion    Knee extension    Ankle dorsiflexion    Ankle plantarflexion    Ankle inversion    Ankle eversion     (Blank rows = not tested)  LUMBAR SPECIAL TESTS:  Slump test: Negative (-) LQ  neuro screen- symmetrical reflexes, dermatomes normal, myotomes equal  FUNCTIONAL TESTS:  5 times sit to stand: deferred , pt able to perform  sit to stand and requires use of UE support Pt able to lie supine on table Pt able to transfer sit to sidelying to supine, and roll to prone independently  GAIT: Pt amb without assistive device today into/out of clinic; amb with decreased gait speed, decreased trunk rotation and decreased hip extension during push off b/l  TODAY'S TREATMENT:                                                                                                                              DATE: 11/13/23  Subjective: Pt reports she did not sleep at all last night; arrives to PT feeling tired.  Her back is sore.  Pain 5/10  Objective: Moist heat x 10 min to lumbar spine to start (unbilled time)-during seated therapeutic exercises today to start  Therapeutic Exercises: SKTC: 30 seconds x 3 ea LE Hamstring stretch with PT: 1 min ea- not today Supine hooklying trunk rotation x 10 x 10 seconds Seated physioball roll out: 10 Seated side bend to b/l: 5x 10 seconds- not today Seated scapular retractions: 2x10- PT tactile/verbal/visual cues Standing scapular retractions: x15- PT tactile/verbal/visual cues Supine hooklying hip add with ball: 3 second holds x 15 Supine hooklying hip abd with ball: 3 second holds x 15 SLR x10 ea LE  Standing hip abd 2# x10 ea Standing hip ext 2# x10 ea Sit to stand x5, 2 sets (from chair with blue airex on seat)  Practiced bed mobility- supine to sidelying to sit; discussed the benefit of using this neutral spine technique for getting in/out of bed at home  Pt education regarding biomechanics and the benefit of practicing upright posture for LBP sx control  HEP instruction/education: see below, discussed how to perform HEP and benefits Exercises  Access Code: [facility] URL: [email or web address] Date: 11/06/2023 Prepared by:  Max Fickle  Exercises - Hooklying Single Knee to Chest Stretch  - 1 x daily - 7 x weekly - 10 reps - 10 hold - Supine Lower Trunk Rotation  - 1 x daily - 7 x weekly - 10 reps - 10 hold - Seated Forward Bending with PLB  - 1 x daily - 7 x weekly - 10 reps - Seated Sidebending  - 1 x daily - 7 x weekly - 5 reps - 10 hold - Seated Hip Abduction with Resistance  - 1 x daily - 7 x weekly - 2 sets - 10 reps - Standing Scapular Retraction  - 1 x daily - 7 x weekly - 2 sets - 10 reps  PATIENT EDUCATION:  Education details: PT POC/goals, purpose of PT to address pain and mobility and strength deficits Person educated: Patient Education method: Explanation Education comprehension: verbalized understanding  HOME EXERCISE PROGRAM: Access Code: [email or web address]  ASSESSMENT:  CLINICAL IMPRESSION: Patient tolerated progression of therapeutic exercises for LE/trunk strengthening well today.  No increase in LBP noted at end of session.  She does respond well to verbal cues for practicing  upright posture during session; tends to slouch into kyphotic posture with dropped head when not cued.  Will benefit from more practice focusing on endurance of trunk extensor mm groups.  She is an appropriate candidate for skilled PT to address the impairments listed below and to facilitate improved ability to perform her daily activities at home without being limited by LBP.   OBJECTIVE IMPAIRMENTS: Abnormal gait, decreased activity tolerance, decreased mobility, decreased ROM, decreased strength, hypomobility, impaired perceived functional ability, and pain.   ACTIVITY LIMITATIONS: carrying, lifting, bending, standing, squatting, and locomotion level  PARTICIPATION LIMITATIONS: meal prep, cleaning, laundry, shopping, and community activity  PERSONAL FACTORS: Age and Time since onset of injury/illness/exacerbation are also affecting patient's functional outcome.   REHAB POTENTIAL: Good  CLINICAL DECISION MAKING:  Stable/uncomplicated  EVALUATION COMPLEXITY: Low   GOALS: Goals reviewed with patient? Yes  SHORT TERM GOALS: Target date: 11/17/23  Pt will be independent with HEP for lumbar spine/LE ROM and mobility Baseline: to be initiated Goal status: INITIAL   LONG TERM GOALS: Target date: 12/18/23  FOTO goal will be established at next tx session Baseline:  Goal status: INITIAL  2.  Pt will demonstrate improved ability to stand x 15 minutes with <4/10 LBP to be able to change sheets/make bed without needing to stop to rest due to LBP Baseline: not able to complete it at one time and notes 8/10 LBP Goal status: INITIAL  3.  Improve LE strength x >1/2 MMT grade to promote improved ability to stand, walk, and perform her daily activities at home x 20 min intervals without being limited by LBP Baseline: 4/5 LE strength Goal status: INITIAL   PLAN:  PT FREQUENCY: 2x/week  PT DURATION: 6 weeks  PLANNED INTERVENTIONS: 97110-Therapeutic exercises, 97530- Therapeutic activity, 97112- Neuromuscular re-education, 97535- Self Care, and [id number]- Manual therapy.  PLAN FOR NEXT SESSION: lumbar spine/LE ROM and mobility exercises, postural mm retraining, modalities for pain control, progress HEP   Max Fickle, PT, DPT, OCS   Ardine Bjork, PT 11/13/2023, 1:22 PM

## 2023-11-15 ENCOUNTER — Ambulatory Visit: Payer: Medicare HMO

## 2023-11-20 ENCOUNTER — Ambulatory Visit: Payer: Medicare HMO

## 2023-11-20 DIAGNOSIS — M47816 Spondylosis without myelopathy or radiculopathy, lumbar region: Secondary | ICD-10-CM | POA: Diagnosis not present

## 2023-11-20 DIAGNOSIS — M6281 Muscle weakness (generalized): Secondary | ICD-10-CM

## 2023-11-20 NOTE — Therapy (Signed)
OUTPATIENT PHYSICAL THERAPY THORACOLUMBAR TREATMENT   Patient Name: Paula Krause MRN: [id number] DOB:1941/02/07, 82 y.o., female Today's Date: 11/20/2023  END OF SESSION: 10/30/23  PT End of Session - 11/20/23 1307     Visit Number 5    Number of Visits 13    Date for PT Re-Evaluation 12/18/23    Authorization Type 2x/week x 6 weeks (PN at visit 10, recert at 12/18/23)    PT Start Time 1247    PT Stop Time 1330    PT Time Calculation (min) 43 min    Activity Tolerance Patient tolerated treatment well    Behavior During Therapy [facility] for tasks assessed/performed             Past Medical History:  Diagnosis Date   Anxiety    Asthma    Cataract    right eye   COPD (chronic obstructive pulmonary disease) (HCC)    Depression    Esophagitis    GERD (gastroesophageal reflux disease)    Insomnia    Memory difficulties    Osteoporosis    Shortness of breath dyspnea    Tobacco abuse    Wears dentures    full upper, partial lower   Past Surgical History:  Procedure Laterality Date   ABDOMINAL HYSTERECTOMY     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   COLONOSCOPY WITH PROPOFOL N/A 01/15/2016   Procedure: COLONOSCOPY WITH PROPOFOL;  Surgeon: Midge Minium, MD;  Location: [facility];  Service: Endoscopy;  Laterality: N/A;   MYRINGOTOMY WITH TUBE PLACEMENT Bilateral 10/14/2021   Procedure: BILATERAL MYRINGOTOMY WITH TUBE PLACEMENT;  Surgeon: Vernie Murders, MD;  Location: [facility];  Service: ENT;  Laterality: Bilateral;   POLYPECTOMY  01/15/2016   Procedure: POLYPECTOMY;  Surgeon: Midge Minium, MD;  Location: [facility];  Service: Endoscopy;;   Patient Active Problem List   Diagnosis Date Noted   Atrial fibrillation with rapid ventricular response (HCC)    Pneumonia    Arterial hypotension    Smoker    Centrilobular emphysema (HCC)    Sepsis (HCC) 02/23/2016   Special screening for malignant neoplasms, colon    Benign neoplasm of sigmoid colon     Black stools 12/25/2015   Allergic rhinitis 08/24/2015   Asthma    COPD, severe (HCC)    Depression    Anxiety    Esophagitis    GERD (gastroesophageal reflux disease)    Insomnia    Osteoporosis    Tobacco abuse     PCP: Dr. Vinson Moselle, MD  REFERRING PROVIDER: Burman Freestone, FNP  REFERRING DIAG: [phone]   Rationale for Evaluation and Treatment: Rehabilitation  THERAPY DIAG:  Spondylosis of lumbar region without myelopathy or radiculopathy  Muscle weakness (generalized)  ONSET DATE: chronic  SUBJECTIVE:      From initial evaluation note 10/30/23  SUBJECTIVE STATEMENT: Pt reports she is having LBP.  She reports it has been going on for awhile.  She reports she feels worse with standing >10 min consecutively  Functionally- she is having difficulty with making her bed; has to split up the activity.    Saw MD; was referred to PT and she reports trying some medication that she does not notice a big difference.  Aggravating: prolonged standing, making her bed, doing housechores/housework, going up/down stairs   Alleviating: heat, warm showers  PERTINENT HISTORY:  Chronic low back pain, no recent MOI or changes in status noted; no N/T noted, sx remain in low back  PAIN:  Are you having pain? Yes, "constant/achy", currently 7/10, 0/10 at best, 8/10 at worst  PRECAUTIONS: None  RED FLAGS: None   WEIGHT BEARING RESTRICTIONS: No  FALLS:  Has patient fallen in last 6 months? No; then later stated she did fall a few months ago while carrying her groceries  LIVING ENVIRONMENT: Lives with: lives alone, has family nearby, has a little Financial trader" at home  Lives in: House/apartment Stairs: No Has following equipment at home: Single point cane- has it but does not need it  OCCUPATION:  retired  PLOF: Independent  PATIENT GOALS: to be able to perform her daily activities without being limited by her low back pain- specifically activities in her home  NEXT MD VISIT: 11/27/23  OBJECTIVE:  Note: Objective measures were completed at Evaluation unless otherwise noted.  DIAGNOSTIC FINDINGS:  Xrays from [facility] dated 09/07/2023 demonstrated osteopenia without fracture and mild degenerative changes noted (per chart review)   PATIENT SURVEYS:  FOTO will be assessed at future visit (clinic lost power and internet during today's session, unable to access FOTO)  SCREENING FOR RED FLAGS: Compression fracture: has osteopenia; recent x-ray in Oct (-) compression fx  COGNITION: Overall cognitive status: Within functional limits for tasks assessed     SENSATION: WFL  POSTURE: decreased lumbar lordosis  PALPATION:  Tender, but pt reports not her typical sx with palpation of lumbar L1-4, surrounding mm  LUMBAR ROM:   AROM eval  Flexion 25% limited  Extension 50% limited painful  Right lateral flexion 50% limited  painful  Left lateral flexion 50% limited  Right rotation 25% limited  Left rotation 25% limited   (Blank rows = not tested)  LOWER EXTREMITY ROM:     Active  Right eval Left eval  Hip flexion 4 4  Hip extension    Hip abduction 4 4  Hip adduction    Hip internal rotation    Hip external rotation    Knee flexion 4 4  Knee extension 4 4  Ankle dorsiflexion 4 4  Ankle plantarflexion    Ankle inversion    Ankle eversion     (Blank rows = not tested)  LOWER EXTREMITY MMT:    MMT Right eval Left eval  Hip flexion [facility]  Hip extension    Hip abduction    Hip adduction (-) FADDIR (-) FADDIR  Hip internal rotation    Hip external rotation    Knee flexion    Knee extension    Ankle dorsiflexion    Ankle plantarflexion    Ankle inversion    Ankle eversion     (Blank rows = not tested)  LUMBAR SPECIAL TESTS:  Slump test: Negative (-) LQ  neuro screen- symmetrical reflexes, dermatomes normal, myotomes equal  FUNCTIONAL TESTS:  5 times sit to stand: deferred , pt able to perform  sit to stand and requires use of UE support Pt able to lie supine on table Pt able to transfer sit to sidelying to supine, and roll to prone independently  GAIT: Pt amb without assistive device today into/out of clinic; amb with decreased gait speed, decreased trunk rotation and decreased hip extension during push off b/l  TODAY'S TREATMENT:                                                                                                                              DATE: 11/20/23  Subjective: Pt reports she is making her bed at home with less back pain.  Working on her stretches in the morning.  Pain 3-4/10  Objective: Moist heat x 10 min to lumbar spine to start (unbilled time)-during seated therapeutic exercises today to start- not today  Therapeutic Exercises: 40 min SKTC: 30 seconds x 3 ea LE Hamstring stretch with PT: 1 min ea- not today Supine hooklying trunk rotation x 10 x 10 seconds Seated physioball roll out: 10 Seated side bend to b/l: 5x 10 seconds- not today Standing scapular retractions: x15- PT tactile/verbal/visual cues Supine hooklying hip add with ball: 3 second holds x 20 Supine hooklying hip abd with ball: 3 second holds x 20 SLR x10 ea LE  Standing hip abd 2# x10 ea Standing hip ext 2# x10 ea Sit to stand x5, 2 sets (from chair with blue airex on seat) Seated knee extension 2# 2x15 ea LE  Practiced bed mobility- supine to sidelying to sit; discussed the benefit of using this neutral spine technique for getting in/out of bed at home  Pt education regarding biomechanics and the benefit of practicing upright posture for LBP sx control  HEP instruction/education: see below, discussed how to perform HEP and benefits Exercises  Access Code: [facility] URL: [email or web address] Date: 11/06/2023 Prepared by:  Max Fickle  Exercises - Hooklying Single Knee to Chest Stretch  - 1 x daily - 7 x weekly - 10 reps - 10 hold - Supine Lower Trunk Rotation  - 1 x daily - 7 x weekly - 10 reps - 10 hold - Seated Forward Bending with PLB  - 1 x daily - 7 x weekly - 10 reps - Seated Sidebending  - 1 x daily - 7 x weekly - 5 reps - 10 hold - Seated Hip Abduction with Resistance  - 1 x daily - 7 x weekly - 2 sets - 10 reps - Standing Scapular Retraction  - 1 x daily - 7 x weekly - 2 sets - 10 reps  PATIENT EDUCATION:  Education details: PT POC/goals, purpose of PT to address pain and mobility and strength deficits Person educated: Patient Education method: Explanation Education comprehension: verbalized understanding  HOME EXERCISE PROGRAM: Access Code: [email or web address]  ASSESSMENT:  CLINICAL IMPRESSION: Patient able to perform scapular retraction with improved motor control today and reduced PT tactile/verbal cues.  Overall she tolerated therapeutic exercises for strength and mobility  well today with no report of increased LBP at end of session.  Will benefit from more practice focusing on endurance of trunk extensor mm groups.  She is an appropriate candidate for skilled PT to address the impairments listed below and to facilitate improved ability to perform her daily activities at home without being limited by LBP.   OBJECTIVE IMPAIRMENTS: Abnormal gait, decreased activity tolerance, decreased mobility, decreased ROM, decreased strength, hypomobility, impaired perceived functional ability, and pain.   ACTIVITY LIMITATIONS: carrying, lifting, bending, standing, squatting, and locomotion level  PARTICIPATION LIMITATIONS: meal prep, cleaning, laundry, shopping, and community activity  PERSONAL FACTORS: Age and Time since onset of injury/illness/exacerbation are also affecting patient's functional outcome.   REHAB POTENTIAL: Good  CLINICAL DECISION MAKING: Stable/uncomplicated  EVALUATION COMPLEXITY:  Low   GOALS: Goals reviewed with patient? Yes  SHORT TERM GOALS: Target date: 11/17/23  Pt will be independent with HEP for lumbar spine/LE ROM and mobility Baseline: to be initiated Goal status: INITIAL   LONG TERM GOALS: Target date: 12/18/23  FOTO goal will be established at next tx session Baseline:  Goal status: INITIAL  2.  Pt will demonstrate improved ability to stand x 15 minutes with <4/10 LBP to be able to change sheets/make bed without needing to stop to rest due to LBP Baseline: not able to complete it at one time and notes 8/10 LBP Goal status: INITIAL  3.  Improve LE strength x >1/2 MMT grade to promote improved ability to stand, walk, and perform her daily activities at home x 20 min intervals without being limited by LBP Baseline: 4/5 LE strength Goal status: INITIAL   PLAN:  PT FREQUENCY: 2x/week  PT DURATION: 6 weeks  PLANNED INTERVENTIONS: 97110-Therapeutic exercises, 97530- Therapeutic activity, 97112- Neuromuscular re-education, 97535- Self Care, and 16109- Manual therapy.  PLAN FOR NEXT SESSION: lumbar spine/LE ROM and mobility exercises, postural mm retraining, modalities for pain control, progress HEP   Max Fickle, PT, DPT, OCS   Ardine Bjork, PT 11/20/2023, 1:08 PM

## 2023-11-27 ENCOUNTER — Ambulatory Visit: Payer: Medicare HMO

## 2023-11-27 DIAGNOSIS — M47816 Spondylosis without myelopathy or radiculopathy, lumbar region: Secondary | ICD-10-CM | POA: Diagnosis not present

## 2023-11-27 DIAGNOSIS — M6281 Muscle weakness (generalized): Secondary | ICD-10-CM

## 2023-11-27 NOTE — Therapy (Signed)
OUTPATIENT PHYSICAL THERAPY THORACOLUMBAR TREATMENT   Patient Name: Paula Krause MRN: [id number] DOB:09/01/1941, 82 y.o., female Today's Date: 11/27/2023  END OF SESSION: 10/30/23  PT End of Session - 11/27/23 1235     Visit Number 6    Number of Visits 13    Date for PT Re-Evaluation 12/18/23    Authorization Type 2x/week x 6 weeks (PN at visit 10, recert at 12/18/23)    PT Start Time 1245    PT Stop Time 1330    PT Time Calculation (min) 45 min    Activity Tolerance Patient tolerated treatment well    Behavior During Therapy [facility] for tasks assessed/performed             Past Medical History:  Diagnosis Date   Anxiety    Asthma    Cataract    right eye   COPD (chronic obstructive pulmonary disease) (HCC)    Depression    Esophagitis    GERD (gastroesophageal reflux disease)    Insomnia    Memory difficulties    Osteoporosis    Shortness of breath dyspnea    Tobacco abuse    Wears dentures    full upper, partial lower   Past Surgical History:  Procedure Laterality Date   ABDOMINAL HYSTERECTOMY     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   COLONOSCOPY WITH PROPOFOL N/A 01/15/2016   Procedure: COLONOSCOPY WITH PROPOFOL;  Surgeon: Midge Minium, MD;  Location: [facility];  Service: Endoscopy;  Laterality: N/A;   MYRINGOTOMY WITH TUBE PLACEMENT Bilateral 10/14/2021   Procedure: BILATERAL MYRINGOTOMY WITH TUBE PLACEMENT;  Surgeon: Vernie Murders, MD;  Location: [facility];  Service: ENT;  Laterality: Bilateral;   POLYPECTOMY  01/15/2016   Procedure: POLYPECTOMY;  Surgeon: Midge Minium, MD;  Location: [facility];  Service: Endoscopy;;   Patient Active Problem List   Diagnosis Date Noted   Atrial fibrillation with rapid ventricular response (HCC)    Pneumonia    Arterial hypotension    Smoker    Centrilobular emphysema (HCC)    Sepsis (HCC) 02/23/2016   Special screening for malignant neoplasms, colon    Benign neoplasm of sigmoid colon     Black stools 12/25/2015   Allergic rhinitis 08/24/2015   Asthma    COPD, severe (HCC)    Depression    Anxiety    Esophagitis    GERD (gastroesophageal reflux disease)    Insomnia    Osteoporosis    Tobacco abuse     PCP: Dr. Vinson Moselle, MD  REFERRING PROVIDER: Burman Freestone, FNP  REFERRING DIAG: [phone]   Rationale for Evaluation and Treatment: Rehabilitation  THERAPY DIAG:  Spondylosis of lumbar region without myelopathy or radiculopathy  Muscle weakness (generalized)  ONSET DATE: chronic  SUBJECTIVE:      From initial evaluation note 10/30/23  SUBJECTIVE STATEMENT: Pt reports she is having LBP.  She reports it has been going on for awhile.  She reports she feels worse with standing >10 min consecutively  Functionally- she is having difficulty with making her bed; has to split up the activity.    Saw MD; was referred to PT and she reports trying some medication that she does not notice a big difference.  Aggravating: prolonged standing, making her bed, doing housechores/housework, going up/down stairs   Alleviating: heat, warm showers  PERTINENT HISTORY:  Chronic low back pain, no recent MOI or changes in status noted; no N/T noted, sx remain in low back  PAIN:  Are you having pain? Yes, "constant/achy", currently 7/10, 0/10 at best, 8/10 at worst  PRECAUTIONS: None  RED FLAGS: None   WEIGHT BEARING RESTRICTIONS: No  FALLS:  Has patient fallen in last 6 months? No; then later stated she did fall a few months ago while carrying her groceries  LIVING ENVIRONMENT: Lives with: lives alone, has family nearby, has a little Financial trader" at home  Lives in: House/apartment Stairs: No Has following equipment at home: Single point cane- has it but does not need it  OCCUPATION:  retired  PLOF: Independent  PATIENT GOALS: to be able to perform her daily activities without being limited by her low back pain- specifically activities in her home  NEXT MD VISIT: 11/27/23  OBJECTIVE:  Note: Objective measures were completed at Evaluation unless otherwise noted.  DIAGNOSTIC FINDINGS:  Xrays from [facility] dated 09/07/2023 demonstrated osteopenia without fracture and mild degenerative changes noted (per chart review)   PATIENT SURVEYS:  FOTO will be assessed at future visit (clinic lost power and internet during today's session, unable to access FOTO)  SCREENING FOR RED FLAGS: Compression fracture: has osteopenia; recent x-ray in Oct (-) compression fx  COGNITION: Overall cognitive status: Within functional limits for tasks assessed     SENSATION: WFL  POSTURE: decreased lumbar lordosis  PALPATION:  Tender, but pt reports not her typical sx with palpation of lumbar L1-4, surrounding mm  LUMBAR ROM:   AROM eval  Flexion 25% limited  Extension 50% limited painful  Right lateral flexion 50% limited  painful  Left lateral flexion 50% limited  Right rotation 25% limited  Left rotation 25% limited   (Blank rows = not tested)  LOWER EXTREMITY ROM:     Active  Right eval Left eval  Hip flexion 4 4  Hip extension    Hip abduction 4 4  Hip adduction    Hip internal rotation    Hip external rotation    Knee flexion 4 4  Knee extension 4 4  Ankle dorsiflexion 4 4  Ankle plantarflexion    Ankle inversion    Ankle eversion     (Blank rows = not tested)  LOWER EXTREMITY MMT:    MMT Right eval Left eval  Hip flexion [facility]  Hip extension    Hip abduction    Hip adduction (-) FADDIR (-) FADDIR  Hip internal rotation    Hip external rotation    Knee flexion    Knee extension    Ankle dorsiflexion    Ankle plantarflexion    Ankle inversion    Ankle eversion     (Blank rows = not tested)  LUMBAR SPECIAL TESTS:  Slump test: Negative (-) LQ  neuro screen- symmetrical reflexes, dermatomes normal, myotomes equal  FUNCTIONAL TESTS:  5 times sit to stand: deferred , pt able to perform  sit to stand and requires use of UE support Pt able to lie supine on table Pt able to transfer sit to sidelying to supine, and roll to prone independently  GAIT: Pt amb without assistive device today into/out of clinic; amb with decreased gait speed, decreased trunk rotation and decreased hip extension during push off b/l  TODAY'S TREATMENT:                                                                                                                              DATE: 11/27/23  Subjective: Pt reports her back is sore upon arrival; about the same as last sesison.  Pain 3-4/10  Objective: Moist heat x 10 min to lumbar spine to start (unbilled time)-during seated therapeutic exercises today to start- offered, pt declines  Therapeutic Exercises: 40 min SKTC: 30 seconds x 3 ea LE Hamstring stretch with PT: 1 min ea- not today Supine hooklying trunk rotation x 10 x 10 seconds Seated physioball roll out: 10 Seated side bend to b/l: 5x 10 seconds- not today Standing scapular retractions: x15- PT tactile/verbal/visual cues Supine hooklying hip add with ball: 3 second holds x 20- not today- HEP Supine hooklying hip abd with ball: 3 second holds x 20- not today- HEP SLR x10 ea LE not today Standing hip abd 2# x10 ea Standing hip ext 2# x10 ea Sit to stand x5, 2 sets (from chair with blue airex on seat) Seated knee extension 3# 2x10  ea LE Seated marches with emphasis on upright trunk and PT verbal cues for upright head too x20 ea LE 3# ankle weights Push/pull with wand and PT manual resistance, focus on upright trunk, normal breathing, and keeping head up- 3x15 Isometric trunk rotation with wand and PT manual resistance R and L, focus on upright trunk, normal breathing, and keeping head up 3x5 ea direction    Practiced bed mobility- supine to  sidelying to sit; discussed the benefit of using this neutral spine technique for getting in/out of bed at home- not today  Pt education regarding biomechanics and the benefit of practicing upright posture for LBP sx control  HEP instruction/education: see below, discussed how to perform HEP and benefits Exercises  Access Code: [email or web address] URL: [email or web address] Date: 11/06/2023 Prepared by: Max Fickle  Exercises - Hooklying Single Knee to Chest Stretch  - 1 x daily - 7 x weekly - 10 reps - 10 hold - Supine Lower Trunk Rotation  - 1 x daily - 7 x weekly - 10 reps - 10 hold - Seated Forward Bending with PLB  - 1 x daily - 7 x weekly - 10 reps - Seated Sidebending  - 1 x daily - 7 x weekly - 5 reps - 10 hold - Seated Hip Abduction with Resistance  - 1 x daily - 7 x weekly - 2 sets - 10 reps - Standing Scapular Retraction  - 1 x daily - 7 x weekly - 2 sets -  10 reps  PATIENT EDUCATION:  Education details: PT POC/goals, purpose of PT to address pain and mobility and strength deficits Person educated: Patient Education method: Explanation Education comprehension: verbalized understanding  HOME EXERCISE PROGRAM: Access Code: [email or web address]  ASSESSMENT:  CLINICAL IMPRESSION: Patient does still require frequent tactile/verbal cues to achieve optimal upright trunk posture and spinal/postural mm activation during therapeutic exercises today.  She was quite challenged with trunk isometric rotation and push/pull functional exercises today.  Overall she tolerated therapeutic exercises for strength and mobility well today with no report of increased LBP at end of session.  Will benefit from more practice focusing on endurance of trunk extensor mm groups.  She is an appropriate candidate for skilled PT to address the impairments listed below and to facilitate improved ability to perform her daily activities at home without being limited by LBP.   OBJECTIVE IMPAIRMENTS: Abnormal gait,  decreased activity tolerance, decreased mobility, decreased ROM, decreased strength, hypomobility, impaired perceived functional ability, and pain.   ACTIVITY LIMITATIONS: carrying, lifting, bending, standing, squatting, and locomotion level  PARTICIPATION LIMITATIONS: meal prep, cleaning, laundry, shopping, and community activity  PERSONAL FACTORS: Age and Time since onset of injury/illness/exacerbation are also affecting patient's functional outcome.   REHAB POTENTIAL: Good  CLINICAL DECISION MAKING: Stable/uncomplicated  EVALUATION COMPLEXITY: Low   GOALS: Goals reviewed with patient? Yes  SHORT TERM GOALS: Target date: 11/17/23  Pt will be independent with HEP for lumbar spine/LE ROM and mobility Baseline: to be initiated Goal status: INITIAL   LONG TERM GOALS: Target date: 12/18/23  FOTO goal will be established at next tx session Baseline:  Goal status: INITIAL  2.  Pt will demonstrate improved ability to stand x 15 minutes with <4/10 LBP to be able to change sheets/make bed without needing to stop to rest due to LBP Baseline: not able to complete it at one time and notes 8/10 LBP Goal status: INITIAL  3.  Improve LE strength x >1/2 MMT grade to promote improved ability to stand, walk, and perform her daily activities at home x 20 min intervals without being limited by LBP Baseline: 4/5 LE strength Goal status: INITIAL   PLAN:  PT FREQUENCY: 2x/week  PT DURATION: 6 weeks  PLANNED INTERVENTIONS: 97110-Therapeutic exercises, 97530- Therapeutic activity, 97112- Neuromuscular re-education, 97535- Self Care, and 11914- Manual therapy.  PLAN FOR NEXT SESSION: lumbar spine/LE ROM and mobility exercises, postural mm retraining, modalities for pain control, progress HEP   Max Fickle, PT, DPT, OCS   Ardine Bjork, PT 11/27/2023, 12:36 PM

## 2023-12-04 ENCOUNTER — Ambulatory Visit: Payer: Medicare HMO

## 2023-12-06 ENCOUNTER — Other Ambulatory Visit: Payer: Self-pay | Admitting: Family Medicine

## 2023-12-06 DIAGNOSIS — M5416 Radiculopathy, lumbar region: Secondary | ICD-10-CM

## 2023-12-11 ENCOUNTER — Ambulatory Visit: Payer: Medicare HMO | Attending: Family Medicine

## 2023-12-12 ENCOUNTER — Ambulatory Visit
Admission: RE | Admit: 2023-12-12 | Discharge: 2023-12-12 | Disposition: A | Discharge status: Home / Self Care | Payer: Medicare HMO | Source: Ambulatory Visit | Attending: Family Medicine | Admitting: Family Medicine

## 2023-12-12 DIAGNOSIS — M5416 Radiculopathy, lumbar region: Secondary | ICD-10-CM

## 2023-12-13 ENCOUNTER — Ambulatory Visit: Payer: Medicare HMO

## 2023-12-19 ENCOUNTER — Other Ambulatory Visit: Payer: Medicare HMO
